# Patient Record
Sex: Male | Born: 1990
Health system: Southern US, Community
[De-identification: ages and names within clinical notes are randomized; demographics above are authoritative.]

## PROBLEM LIST (undated history)

## (undated) DIAGNOSIS — B019 Varicella without complication: Secondary | ICD-10-CM

## (undated) DIAGNOSIS — B001 Herpesviral vesicular dermatitis: Secondary | ICD-10-CM

## (undated) HISTORY — DX: Varicella without complication: B01.9

## (undated) HISTORY — DX: Herpesviral vesicular dermatitis: B00.1

## (undated) HISTORY — PX: WISDOM TOOTH EXTRACTION: SHX21

---

## 2014-02-14 HISTORY — PX: OTHER SURGICAL HISTORY: SHX169

## 2015-09-22 ENCOUNTER — Telehealth: Payer: Self-pay | Admitting: Behavioral Health

## 2015-09-22 NOTE — Telephone Encounter (Signed)
Attempted to reach patient for Pre-Visit Call. Unable to leave a message; per recording, the patient's voice mailbox is full and cannot accept any messages at this time.

## 2015-09-23 ENCOUNTER — Ambulatory Visit (INDEPENDENT_AMBULATORY_CARE_PROVIDER_SITE_OTHER): Payer: 59 | Admitting: Family Medicine

## 2015-09-23 ENCOUNTER — Encounter: Payer: Self-pay | Admitting: Family Medicine

## 2015-09-23 VITALS — BP 130/68 | HR 78 | Temp 98.2°F | Ht 68.0 in | Wt 143.2 lb

## 2015-09-23 DIAGNOSIS — Z7189 Other specified counseling: Secondary | ICD-10-CM

## 2015-09-23 DIAGNOSIS — B001 Herpesviral vesicular dermatitis: Secondary | ICD-10-CM | POA: Diagnosis not present

## 2015-09-23 DIAGNOSIS — Z7689 Persons encountering health services in other specified circumstances: Secondary | ICD-10-CM

## 2015-09-23 MED ORDER — VALACYCLOVIR HCL 1 G PO TABS: ORAL_TABLET | ORAL | 2 refills | Status: DC

## 2015-09-23 NOTE — Patient Instructions (Signed)
It was nice to meet you today- I hope that your ankle continue to improve You can use the valtrex as needed for cold sore- start the treatment at the first sign of a cold sore outbreak Please come and see me for a physical at your convenience. If you could bring a copy of your labs from work that would be great.  Take care!

## 2015-09-23 NOTE — Progress Notes (Signed)
Pre visit review using our clinic review tool, if applicable. No additional management support is needed unless otherwise documented below in the visit note. 

## 2015-09-23 NOTE — Progress Notes (Signed)
[facility] [address], [address] [place], [place] [address] [phone] [phone]  Date:  09/23/2015   Name:  Brian Jones   DOB:  12-09-1990   MRN:  [id number]  PCP:  Abbe AmsterdamOPLAND,Erica Osuna, MD    Chief Complaint: Establish Care (Pt here to est care. Refill needed on valacyclovir and would also like to make sure )   History of Present Illness:  Brian Grantmmett Leask is a 25 y.o. 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 He also got shingles last year during illness. This is now resolved  He had an ankle operation in December- done by Dr. Lajoyce Cornersuda. It seems to be doing well for him, is improving He plays soccer and has been able to play again just recently He has had multiple ankle sprains over his lifetime which led to his eventual surgery  He did have a health screening at work- he did well on his labs as far as he knows He plans to have a CPE with me soon and would rather do a tetanus booster then  There are no active problems to display for this patient.   Past Medical History:  Diagnosis Date  . Chicken pox   . Recurrent cold sores     Past Surgical History:  Procedure Laterality Date  . Reconstructive ankle surgery  2016  . WISDOM TOOTH EXTRACTION      Social History  Substance Use Topics  . Smoking status: Never Smoker  . Smokeless tobacco: Never Used  . Alcohol use 3.6 oz/week    6 Cans of beer per week    Family History  Problem Relation Age of Onset  . Healthy Mother   . Healthy Father   . Healthy Sister   . Healthy Brother   . Alzheimer's disease Maternal Grandmother     Allergies  Allergen Reactions  . Amoxicillin Hives     Medication list has been reviewed and updated.  No current outpatient prescriptions on file prior to visit.   No current facility-administered medications on file prior to visit.     Review of Systems:  As per HPI- otherwise negative. No fever, chills, nausea, vomiting, rash, abd pain, diarrhea, chest pain or SOB  Physical Examination: Vitals:   09/23/15 1337  BP: 140/69  Pulse: 78  Temp: 98.2 F (36.8 C)   Vitals:   09/23/15 1337  Weight: 143 lb 3.2 oz (65 kg)  Height: 5\' 8"  (1.727 m)   Body mass index is 21.77 kg/m. Ideal Body Weight: Weight in (lb) to have BMI = 25: 164.1  GEN: WDWN, NAD, Non-toxic, A & O x 3, normal weight, looks well HEENT: Atraumatic, Normocephalic. Neck supple. No masses, No LAD.  Bilateral TM wnl, oropharynx normal.  PEERL,EOMI.   Ears and Nose: No external deformity. CV: RRR, No M/G/R. No JVD. No thrill. No extra heart sounds. PULM: CTA B, no wheezes, crackles, rhonchi. No retractions. No resp. distress. No accessory muscle use. EXTR: No c/c/e NEURO Normal gait.  PSYCH: Normally interactive. Conversant. Not depressed or anxious appearing.  Calm demeanor.    Assessment and Plan: Cold sore - Plan: valACYclovir (VALTREX)  1000 MG tablet  Encounter to establish care  Here today to establish care.  Refilled his valtrex that he uses for occasional cold sores He will see me for a CPE soon   Signed Abbe Amsterdam, MD

## 2015-10-29 ENCOUNTER — Encounter: Payer: 59 | Admitting: Family Medicine

## 2016-05-26 ENCOUNTER — Ambulatory Visit (INDEPENDENT_AMBULATORY_CARE_PROVIDER_SITE_OTHER): Payer: BLUE CROSS/BLUE SHIELD | Admitting: Family Medicine

## 2016-05-26 ENCOUNTER — Encounter: Payer: Self-pay | Admitting: Family Medicine

## 2016-05-26 VITALS — BP 130/74 | HR 72 | Temp 97.8°F | Ht 68.0 in | Wt 147.6 lb

## 2016-05-26 DIAGNOSIS — Z7189 Other specified counseling: Secondary | ICD-10-CM

## 2016-05-26 DIAGNOSIS — Z23 Encounter for immunization: Secondary | ICD-10-CM

## 2016-05-26 DIAGNOSIS — Z7184 Encounter for health counseling related to travel: Secondary | ICD-10-CM

## 2016-05-26 MED ORDER — TYPHOID VACCINE PO CPDR: 1.0000 | DELAYED_RELEASE_CAPSULE | ORAL | 0 refills | Status: DC

## 2016-05-26 NOTE — Patient Instructions (Signed)
It was nice to see you today- best wishes for your wedding and honeymoon, I hope you two have a wonderful time!  You got your tetanus booster and hepatitis A vaccine today. You can have your 2nd hep A shot in 6 months if your like  We also gave you an rx for the oral typhoid vaccine today- take 1 dose every other day for 4 doses (8 days total).  Do this asap as you want to complete the series at least a week prior to your trip.

## 2016-05-26 NOTE — Progress Notes (Signed)
[facility] [address] [place], [place] [address] [phone]  Date:  05/26/2016   Name:  Brian Jones   DOB:  [birth year]   MRN:  [id number]  PCP:  Abbe Amsterdam, MD    Chief Complaint: Immunizations (Pt will need vaccines for Honeymoon to [place]. )   History of Present Illness:  Brian Jones is a 26 y.o. very pleasant male patient who presents with the following:  Generally healthy young man here today to discuss upcoming travel plans His wedding is in about 2 weeks, and they are traveling to [place].   He is not sure if he had the Hep A series and these records are not readily attainable.  Will give his first shot today.  We do feel confident that he had the hep B series however His last tetanus was over 10 years ago, he is not sure if he had the tdap or not. Would like to update this today He would like to have typhoid vaccine prior to his trip  He is otherwise feeling well and has no concerns He did feel a pain and pull in his left groin a couple of weeks ago while lifting weights, the pain is now resolved and he does not note any bulge.   He declines to have me examine this today as he thinks it is back to normal, just wanted to mention it  Patient Active Problem List   Diagnosis Date Noted  . Cold sore 09/23/2015    Past Medical History:  Diagnosis Date  . Chicken pox   . Recurrent cold sores     Past Surgical History:  Procedure Laterality Date  . Reconstructive ankle surgery  2016  . WISDOM TOOTH EXTRACTION      Social History  Substance Use Topics  . Smoking status: Never Smoker  . Smokeless tobacco: Never Used  . Alcohol use 3.6 oz/week    6 Cans of beer per week    Family History  Problem Relation Age of Onset  . Healthy Mother   . Healthy Father   . Healthy Sister   . Healthy Brother   . Alzheimer's disease Maternal Grandmother     Allergies  Allergen Reactions   . Amoxicillin Hives    Medication list has been reviewed and updated.  Current Outpatient Prescriptions on File Prior to Visit  Medication Sig Dispense Refill  . Multiple Vitamin (MULTIVITAMIN) tablet Take 1 tablet by mouth daily.    . traMADol (ULTRAM) 50 MG tablet Take 50 mg by mouth every 6 (six) hours as needed.    . valACYclovir (VALTREX) 1000 MG tablet For cold sore outbreak: take 2 pills, then repeat 2 pills 12 hours later 20 tablet 2   No current facility-administered medications on file prior to visit.     Review of Systems:  As per HPI- otherwise negative.   Physical Examination: Vitals:   05/26/16 1504  BP: 130/74  Pulse: 72  Temp: 97.8 F (36.6 C)   Vitals:   05/26/16 1504  Weight: 147 lb 9.6 oz (67 kg)  Height:  (1.727 m)   Body mass index is 22.44 kg/m. Ideal Body Weight: Weight in (lb) to have BMI = 25: 164.1  GEN: WDWN, NAD, Non-toxic, A & O x 3, looks well HEENT: Atraumatic, Normocephalic. Neck supple. No masses, No LAD. Ears and Nose: No external deformity. CV: RRR, No M/G/R. No JVD. No thrill. No  extra heart sounds. PULM: CTA B, no wheezes, crackles, rhonchi. No retractions. No resp. distress. No accessory muscle use. EXTR: No c/c/e NEURO Normal gait.  PSYCH: Normally interactive. Conversant. Not depressed or anxious appearing.  Calm demeanor.    Assessment and Plan: Travel advice encounter - Plan: Tdap vaccine greater than or equal to 7yo IM, Hepatitis A vaccine adult IM, typhoid (VIVOTIF) DR capsule  Vaccines for travel- tdap, hep A, oral typhoid. He is not going anywhere in [place] where malaria is considered to be a risk, declines prophylaxis for this today Advised him to avoid weight lifting until his groin is 100% better, and to avoid valsalva when lifting.  He will let us know if any other concerns   Signed Abbe Amsterdam, MD

## 2016-11-01 DIAGNOSIS — K529 Noninfective gastroenteritis and colitis, unspecified: Secondary | ICD-10-CM | POA: Diagnosis not present

## 2017-01-18 ENCOUNTER — Telehealth: Payer: Self-pay | Admitting: Family Medicine

## 2017-01-18 NOTE — Telephone Encounter (Signed)
Copied from CRM [phone]. Topic: Quick Communication - Rx Refill/Question >> Jan 18, 2017 11:57 AM Percival SpanishKennedy, Cheryl W wrote: Has the patient contacted their pharmacy NO MORE REFILLS     RX      valACYclovir (VALTREX) 1000 MG tablet   Preferred Pharmacy (with phone number or street name)  [facility] [address]   Agent: Please be advised that RX refills may take up to 3 business days. We ask that you follow-up with your pharmacy.

## 2017-01-19 ENCOUNTER — Other Ambulatory Visit: Payer: Self-pay | Admitting: Emergency Medicine

## 2017-01-19 DIAGNOSIS — B001 Herpesviral vesicular dermatitis: Secondary | ICD-10-CM

## 2017-01-19 MED ORDER — VALACYCLOVIR HCL 1 G PO TABS
ORAL_TABLET | ORAL | 2 refills | Status: DC
Start: 2017-01-19 — End: 2018-03-08

## 2017-01-19 NOTE — Telephone Encounter (Signed)
See attached

## 2017-01-20 NOTE — Telephone Encounter (Signed)
Rx sent 01/19/2017.

## 2017-02-08 DIAGNOSIS — R05 Cough: Secondary | ICD-10-CM | POA: Diagnosis not present

## 2017-02-14 NOTE — Progress Notes (Signed)
[facility] [address], [address] [place], [place] [address] [phone] [phone]  Date:  02/16/2017   Name:  Brian Jones   DOB:  [birth year]   MRN:  [id number]  PCP:  Pearline Cablesopland, Jessica C, MD    Chief Complaint: Mass (c/o lump felt on left side of neck x 4 weeks. ) and Bronchitis (dx with bronchitis by Urgent Care on Dec.26th, completed Prednisone but still having sx's. )   History of Present Illness:  Brian Jones is a 27 y.o. very pleasant male patient who presents with the following:  Generally healthy young man here today with concern of bump on his neck.   He got married last year and went [place] a [place]ymoon to [place].  They had a wonderful time.  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   He noted a small lump in his left neck for 3-4 weeks.  Does not seem to be changing or getting bigger On 12/26 he was diagnosed with bronchitis and was treated with prednisone and cough syrup He still does have some cough but it is getting better gradually  He did have a fever the first few days of bronchitis Also exposed to pneumonia and the flu  No sore throat He has noted some swelling of his other cervical nodes   He is generally in good health   No weight loss, no rashes  Admits that he has read some things on the Internet that scared him, he is afraid that he could have leukemia  Patient Active Problem List   Diagnosis Date Noted  . Cold sore 09/23/2015    Past Medical History:  Diagnosis Date  . Chicken pox   . Recurrent cold sores     Past Surgical History:  Procedure Laterality Date  . Reconstructive ankle surgery  2016  . WISDOM TOOTH EXTRACTION      Social History   Tobacco Use  . Smoking status: Never Smoker  . Smokeless  tobacco: Never Used  Substance Use Topics  . Alcohol use: Yes    Alcohol/week: 3.6 oz    Types: 6 Cans of beer per week  . Drug use: Not on file    Family History  Problem Relation Age of Onset  . Healthy Mother   . Healthy Father   . Healthy Sister   . Healthy Brother   . Alzheimer's disease Maternal Grandmother     Allergies  Allergen Reactions  . Amoxicillin Hives    Medication list has been reviewed and updated.  Current Outpatient Medications on File Prior to Visit  Medication Sig Dispense Refill  . valACYclovir (VALTREX) 1000 MG tablet For cold sore outbreak: take 2 pills, then repeat 2 pills 12 hours later 20 tablet 2   No current facility-administered medications on file prior to visit.     Review of Systems:  As per HPI- otherwise negative.   Physical Examination: Vitals:   02/16/17 1400  BP: 132/82  Pulse: 94  Temp: 98.1 F (36.7 C)  SpO2: 98%   Vitals:   02/16/17 1400  Weight: 145 lb 6.4 oz (66 kg)  Height: 5\' 8"  (1.727 m)   Body mass index is 22.11 kg/m. Ideal  Body Weight: Weight in (lb) to have BMI = 25: 164.1  GEN: WDWN, NAD, Non-toxic, A & O x 3, slim build, looks well  HEENT: Atraumatic, Normocephalic. Neck supple. No masses, No LAD. Bilateral TM wnl, oropharynx normal.  PEERL,EOMI.   Ears and Nose: No external deformity. CV: RRR, No M/G/R. No JVD. No thrill. No extra heart sounds. PULM: CTA B, no wheezes, crackles, rhonchi. No retractions. No resp. distress. No accessory muscle use. ABD: S, NT, ND, +BS. No rebound. No HSM. EXTR: No c/c/e NEURO Normal gait.  PSYCH: Normally interactive. Conversant. Not depressed or anxious appearing.  Calm demeanor.  He indicated a possible shotty node in the left anterior cervical chain- tiny. No other noted noted in the neck, supraclavicular  Areas   Assessment and Plan: Cervical lymphadenopathy - Plan: CBC, US Soft Tissue Head/Neck  Here today with concern of lymphadenopathy in his neck. I do not  really appreciate anything abnormal on exam today, but will run a CBC and get an [place] to make sure all is well  Results for orders placed or performed in visit on 02/16/17  CBC  Result Value Ref Range   WBC 6.6 4.0 - 10.5 K/uL   RBC 4.82 4.22 - 5.81 Mil/uL   Platelets 329.0 150.0 - 400.0 K/uL   Hemoglobin 14.8 13.0 - 17.0 g/dL   HCT 78.2 95.6 - 21.3 %   MCV 89.9 78.0 - 100.0 fl   MCHC 34.2 30.0 - 36.0 g/dL   RDW 08.6 57.8 - 46.9 %     Signed Abbe Amsterdam, MD

## 2017-02-16 ENCOUNTER — Encounter: Payer: Self-pay | Admitting: Family Medicine

## 2017-02-16 ENCOUNTER — Ambulatory Visit (INDEPENDENT_AMBULATORY_CARE_PROVIDER_SITE_OTHER): Payer: BLUE CROSS/BLUE SHIELD | Admitting: Family Medicine

## 2017-02-16 VITALS — BP 132/82 | HR 94 | Temp 98.1°F | Ht 68.0 in | Wt 145.4 lb

## 2017-02-16 DIAGNOSIS — R590 Localized enlarged lymph nodes: Secondary | ICD-10-CM

## 2017-02-16 LAB — CBC
HCT: 43.3 % (ref 39.0–52.0)
HEMOGLOBIN: 14.8 g/dL (ref 13.0–17.0)
MCHC: 34.2 g/dL (ref 30.0–36.0)
MCV: 89.9 fl (ref 78.0–100.0)
PLATELETS: 329 10*3/uL (ref 150.0–400.0)
RBC: 4.82 Mil/uL (ref 4.22–5.81)
RDW: 13 % (ref 11.5–15.5)
WBC: 6.6 10*3/uL (ref 4.0–10.5)

## 2017-02-16 NOTE — Patient Instructions (Addendum)
Please give it about one more week and then have your flu shot We will get a blood count today and set you up for an ultrasound of your neck. However, I think that everything is likely ok! c

## 2017-02-22 ENCOUNTER — Encounter: Payer: Self-pay | Admitting: Family Medicine

## 2017-02-22 ENCOUNTER — Ambulatory Visit (HOSPITAL_BASED_OUTPATIENT_CLINIC_OR_DEPARTMENT_OTHER)
Admission: RE | Admit: 2017-02-22 | Discharge: 2017-02-22 | Disposition: A | Discharge status: Home / Self Care | Payer: BLUE CROSS/BLUE SHIELD | Source: Ambulatory Visit | Attending: Family Medicine | Admitting: Family Medicine

## 2017-02-22 DIAGNOSIS — R590 Localized enlarged lymph nodes: Secondary | ICD-10-CM | POA: Insufficient documentation

## 2017-03-15 ENCOUNTER — Ambulatory Visit (INDEPENDENT_AMBULATORY_CARE_PROVIDER_SITE_OTHER): Payer: Self-pay | Admitting: Orthopedic Surgery

## 2018-03-08 ENCOUNTER — Other Ambulatory Visit: Payer: Self-pay | Admitting: Family Medicine

## 2018-03-08 DIAGNOSIS — B001 Herpesviral vesicular dermatitis: Secondary | ICD-10-CM

## 2018-03-08 MED ORDER — VALACYCLOVIR HCL 1 G PO TABS: ORAL_TABLET | ORAL | 0 refills | Status: DC

## 2018-03-08 NOTE — Telephone Encounter (Signed)
Pt has appt 03/14/2018 Dr. Patsy Lageropland

## 2018-03-08 NOTE — Telephone Encounter (Signed)
Copied from CRM [id number]. Topic: Quick Communication - Rx Refill/Question >> Mar 08, 2018 11:44 AM Baldo Daub L wrote: Medication: valACYclovir (VALTREX) 1000 MG tablet  Has the patient contacted their pharmacy? Yes - no refills on file (Agent: If no, request that the patient contact the pharmacy for the refill.) (Agent: If yes, when and what did the pharmacy advise?)  Preferred Pharmacy (with phone number or street name): [id number] [place] [place] - [address] [phone] (Phone) [phone] (Fax)  Agent: Please be advised that RX refills may take up to 3 business days. We ask that you follow-up with your pharmacy.

## 2018-03-10 NOTE — Progress Notes (Signed)
[facility] [address] [place], [place] [address] [phone]  Date:  03/14/2018   Name:  Brian Jones   DOB:  [birth year]   MRN:  [id number]  PCP:  Pearline Cables, MD    Chief Complaint: Medication Refills (no concerns)   History of Present Illness:  Brian Jones is a 28 y.o. very pleasant male patient who presents with the following:  Here today to discuss medications I last saw him about a year ago, with concern of a lymph node in his neck.  We did a CBC and ultrasound were both normal He feels like this area on his left neck is about the same, no change  He just wanted to mention it   He is married, they have 3 dogs  He uses valtrex rarely for cold sore; he may get a couple of cold sores back to back about one a year He would like to do a flu shot today   No night sweats or weight loss Declines routine labs today  He and his wife just bought tickets for a trip to [place] for a friend's wedding.  This sounds like a wonderful vacation  Patient Active Problem List   Diagnosis Date Noted  . Cold sore 09/23/2015    Past Medical History:  Diagnosis Date  . Chicken pox   . Recurrent cold sores     Past Surgical History:  Procedure Laterality Date  . Reconstructive ankle surgery  2016  . WISDOM TOOTH EXTRACTION      Social History   Tobacco Use  . Smoking status: Never Smoker  . Smokeless tobacco: Never Used  Substance Use Topics  . Alcohol use: Yes    Alcohol/week: 6.0 standard drinks    Types: 6 Cans of beer per week  . Drug use: Not on file    Family History  Problem Relation Age of Onset  . Healthy Mother   . Healthy Father   . Healthy Sister   . Healthy Brother   . Alzheimer's disease Maternal Grandmother     Allergies  Allergen Reactions  . Amoxicillin Hives    Medication list has been reviewed and updated.  Current Outpatient Medications on File Prior to Visit   Medication Sig Dispense Refill  . valACYclovir (VALTREX) 1000 MG tablet For cold sore outbreak: take 2 pills, then repeat 2 pills 12 hours later 20 tablet 0   No current facility-administered medications on file prior to visit.     Review of Systems:  As per HPI- otherwise negative. No fever or chills  Physical Examination: Vitals:   03/14/18 1059  BP: 132/88  Pulse: 73  Resp: 16  SpO2: 99%   Vitals:   03/14/18 1059  Weight: 152 lb (68.9 kg)  Height: 5\' 8"  (1.727 m)   Body mass index is 23.11 kg/m. Ideal Body Weight: Weight in (lb) to have BMI = 25: 164.1  GEN: WDWN, NAD, Non-toxic, A & O x 3, normal weight, looks well  HEENT: Atraumatic, Normocephalic. Neck supple. No masses, No LAD.  Bilateral TM wnl, oropharynx normal.  PEERL,EOMI.   No abnormal cervical, head, axially or supraclavicular nodes  Ears and Nose: No external deformity. CV: RRR, No M/G/R. No JVD. No thrill. No extra heart sounds. PULM: CTA B, no wheezes, crackles, rhonchi. No retractions. No resp. distress. No accessory muscle use. EXTR: No c/c/e NEURO Normal gait.  PSYCH: Normally interactive. Conversant. Not depressed  or anxious appearing.  Calm demeanor.    Assessment and Plan: Cold sore  Here today to discuss his Valtrex.  I actually had refill this already, but he was told to come in for a visit as it has been over a year Continues Valtrex as needed Flu shot given Discussed his cervical lymph node, offered to do a repeat ultrasound now, but he declines.  He will let me know if any other concerns  Signed Abbe Amsterdam, MD

## 2018-03-14 ENCOUNTER — Encounter: Payer: Self-pay | Admitting: Family Medicine

## 2018-03-14 ENCOUNTER — Ambulatory Visit: Payer: BLUE CROSS/BLUE SHIELD | Admitting: Family Medicine

## 2018-03-14 VITALS — BP 132/88 | HR 73 | Resp 16 | Ht 68.0 in | Wt 152.0 lb

## 2018-03-14 DIAGNOSIS — B001 Herpesviral vesicular dermatitis: Secondary | ICD-10-CM | POA: Diagnosis not present

## 2018-03-14 NOTE — Patient Instructions (Signed)
It was great to see you today, have a wonderful time on your upcoming trip You got your flu shot today I do not feel any concerning lymph node in your neck, but certainly if you feel it is getting larger or you are concerned we could do an ultrasound at any time Please do tell me if you have any unusual symptoms, such as unexplained weight loss or night sweats

## 2018-10-29 DIAGNOSIS — Z1283 Encounter for screening for malignant neoplasm of skin: Secondary | ICD-10-CM | POA: Diagnosis not present

## 2018-10-29 DIAGNOSIS — D225 Melanocytic nevi of trunk: Secondary | ICD-10-CM | POA: Diagnosis not present

## 2018-12-15 IMAGING — US US SOFT TISSUE HEAD/NECK
1 series · 14 of 25 positions shown · non-contrast
Comparison: None.

CLINICAL DATA: Recent respiratory illness. Palpable left
adenopathy.

EXAM:
ULTRASOUND OF HEAD/NECK SOFT TISSUES
TECHNIQUE: Ultrasound examination of the head and neck soft tissues was
performed in the area of clinical concern.

[Series 1: us soft tissue head/neck · 0.06mm/px · 14 of 27 slices shown]
[im 1/27]
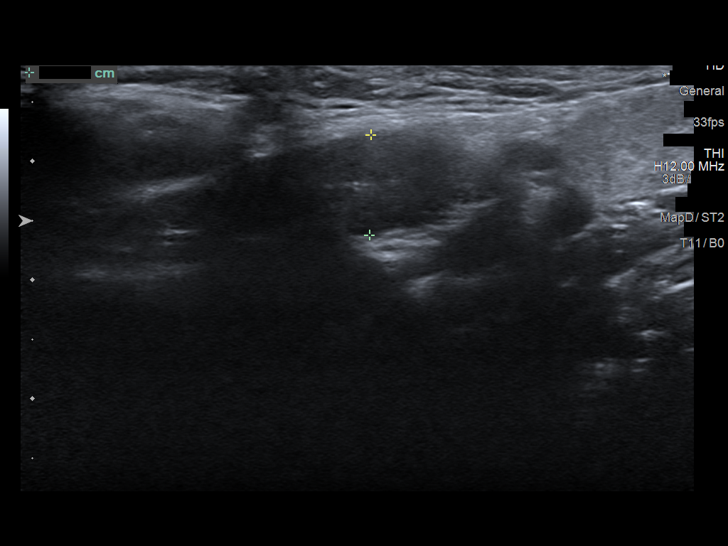
[im 3/27]
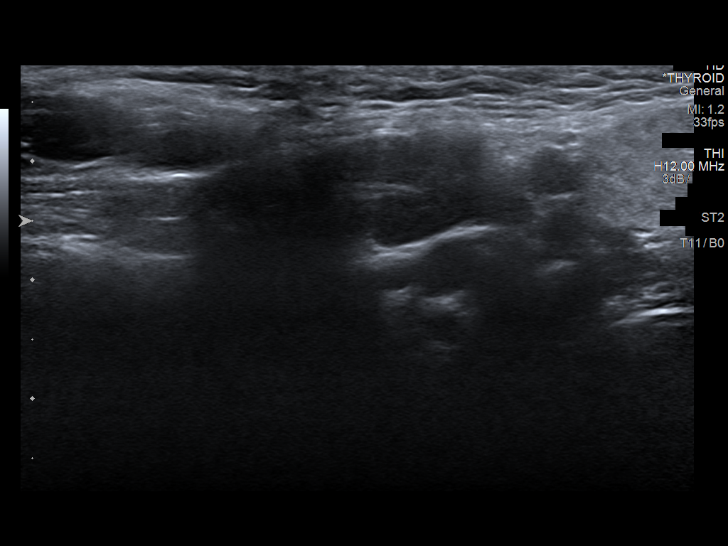
[im 5/27]
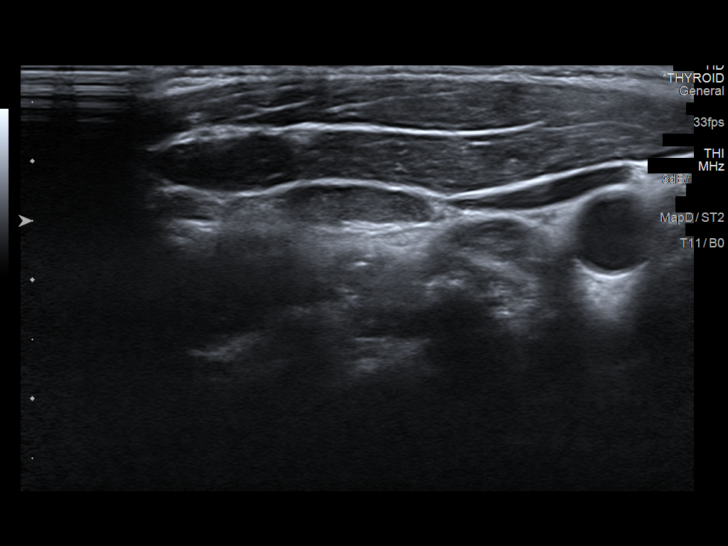
[im 7/27]
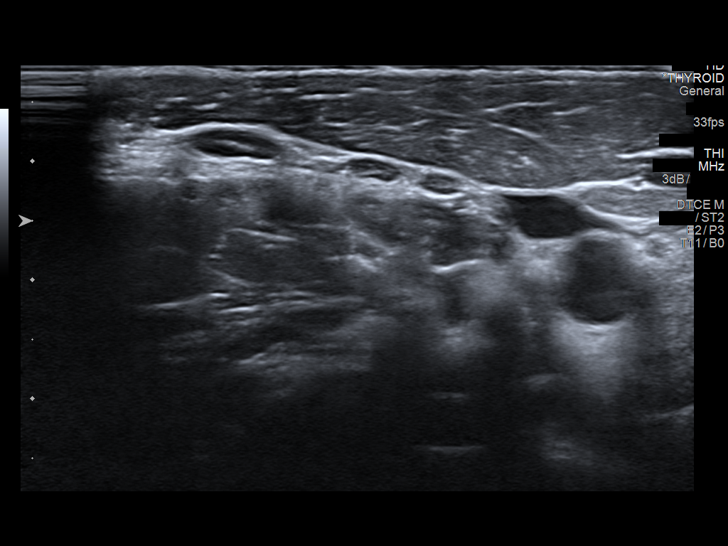
[im 9/27]
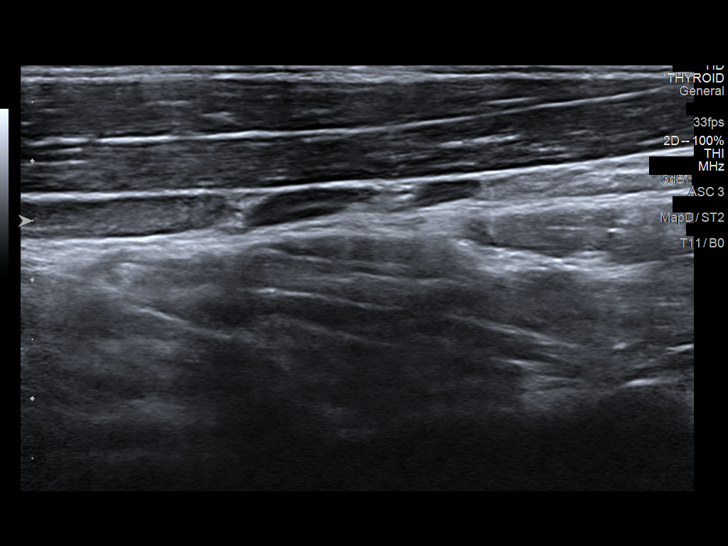
[im 10/27]
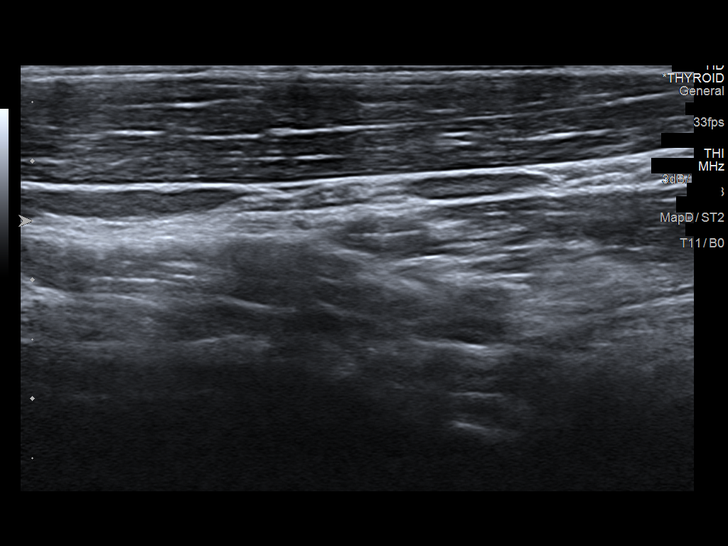
[im 12/27]
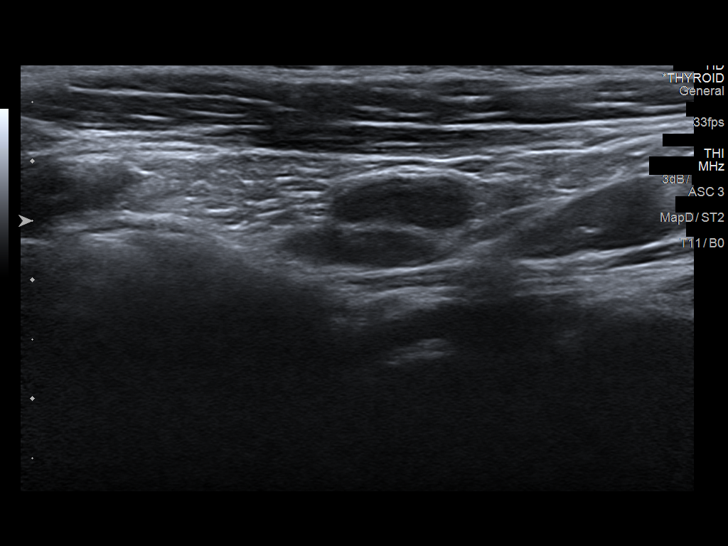
[im 15/27]
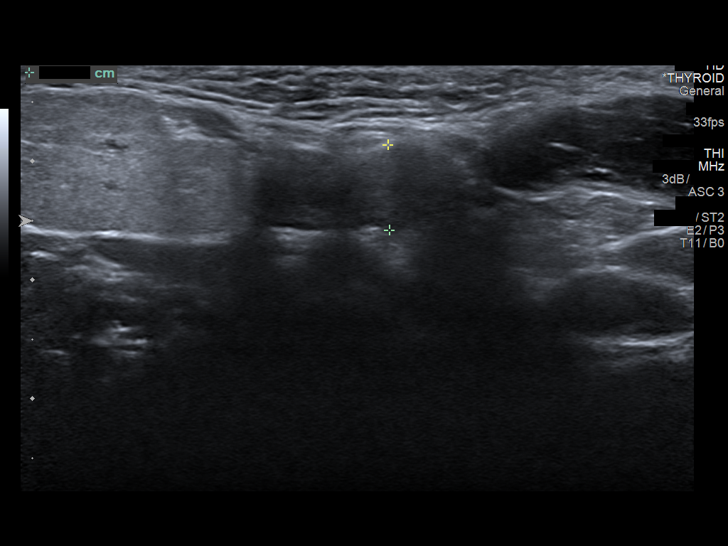
[im 17/27]
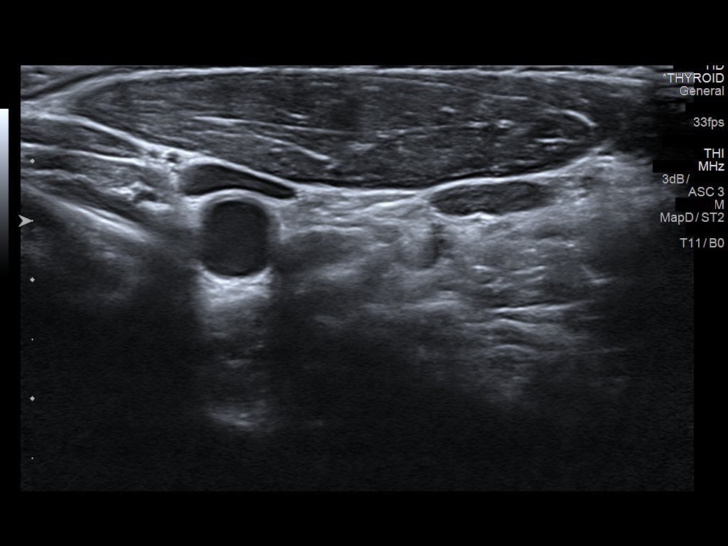
[im 18/27]
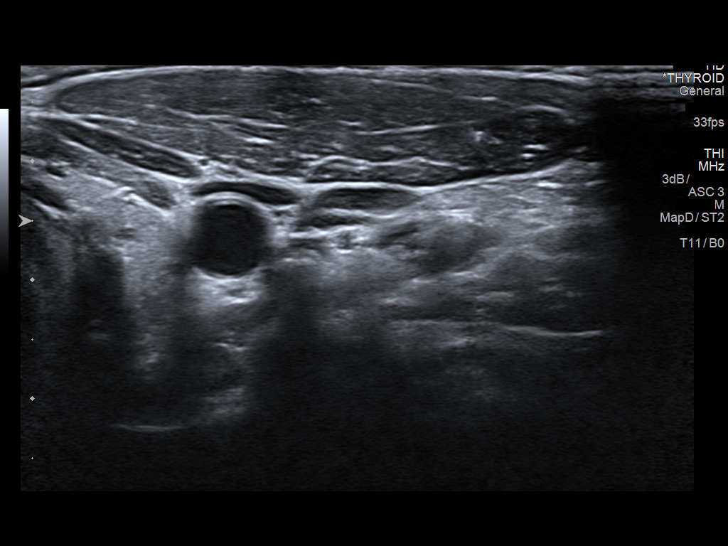
[im 20/27]
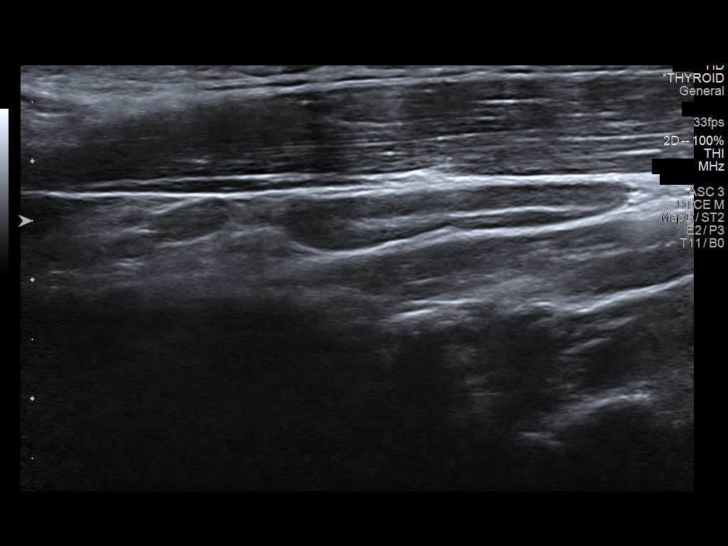
[im 22/27]
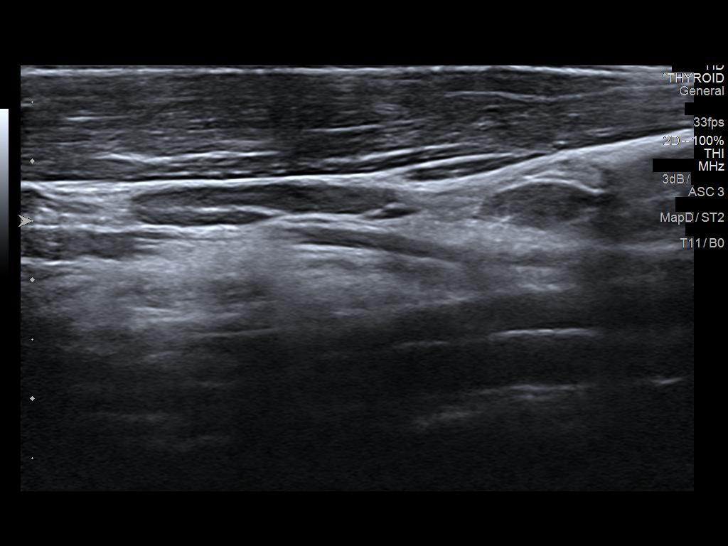
[im 24/27]
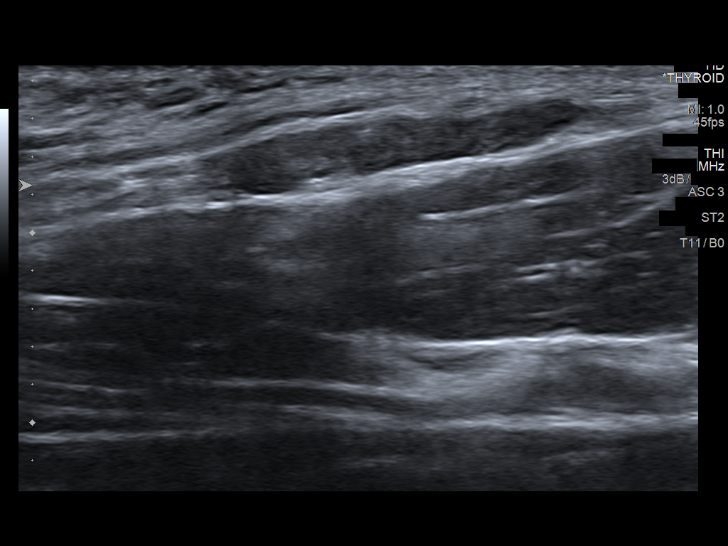
[im 27/27]
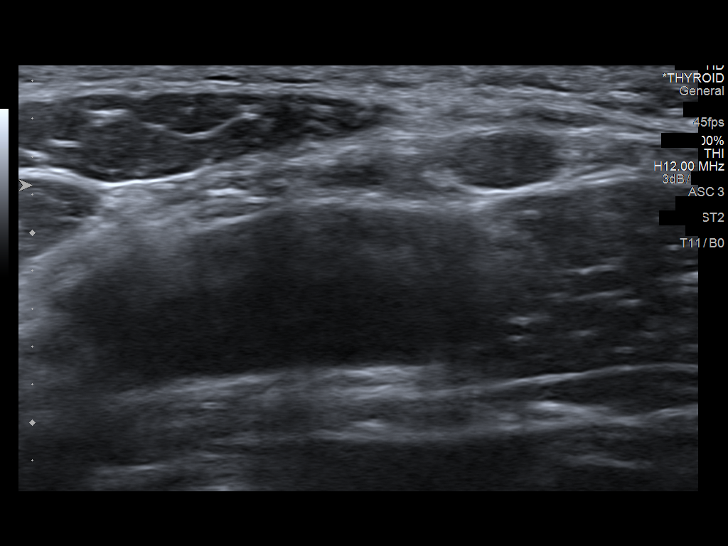

[14 of 25 positions shown; findings below may reference images not displayed]

FINDINGS: Morphologically unremarkable cervical lymph nodes are identified,
largest in the submandibular region measured up to 0.7 cm short axis
diameter left, 0.8 cm right. The region of concern corresponds to a
0.3 cm short axis diameter left cervical chain lymph node.
IMPRESSION: 1. Bilateral cervical lymph nodes without pathologic findings.

## 2019-01-04 DIAGNOSIS — Z20828 Contact with and (suspected) exposure to other viral communicable diseases: Secondary | ICD-10-CM | POA: Diagnosis not present

## 2019-02-07 DIAGNOSIS — Z20828 Contact with and (suspected) exposure to other viral communicable diseases: Secondary | ICD-10-CM | POA: Diagnosis not present

## 2019-02-24 DIAGNOSIS — R519 Headache, unspecified: Secondary | ICD-10-CM | POA: Diagnosis not present

## 2019-02-24 DIAGNOSIS — R05 Cough: Secondary | ICD-10-CM | POA: Diagnosis not present

## 2019-02-24 DIAGNOSIS — Z20822 Contact with and (suspected) exposure to covid-19: Secondary | ICD-10-CM | POA: Diagnosis not present

## 2019-04-24 DIAGNOSIS — L570 Actinic keratosis: Secondary | ICD-10-CM | POA: Diagnosis not present

## 2019-04-24 DIAGNOSIS — X32XXXA Exposure to sunlight, initial encounter: Secondary | ICD-10-CM | POA: Diagnosis not present

## 2019-09-25 DIAGNOSIS — Z20822 Contact with and (suspected) exposure to covid-19: Secondary | ICD-10-CM | POA: Diagnosis not present

## 2019-11-14 DIAGNOSIS — Z20822 Contact with and (suspected) exposure to covid-19: Secondary | ICD-10-CM | POA: Diagnosis not present

## 2020-01-07 DIAGNOSIS — R509 Fever, unspecified: Secondary | ICD-10-CM | POA: Diagnosis not present

## 2020-01-07 DIAGNOSIS — Z20822 Contact with and (suspected) exposure to covid-19: Secondary | ICD-10-CM | POA: Diagnosis not present

## 2020-01-07 DIAGNOSIS — J101 Influenza due to other identified influenza virus with other respiratory manifestations: Secondary | ICD-10-CM | POA: Diagnosis not present

## 2020-01-07 DIAGNOSIS — G4489 Other headache syndrome: Secondary | ICD-10-CM | POA: Diagnosis not present

## 2020-02-11 NOTE — Patient Instructions (Addendum)
Good to see you again today - I am sorry you are not feeling great Please go to lab and then have an x-ray on the ground floor Your EKG looks fine I will be in touch with your other results asap  Your symptoms may be related to your shot, or could be unrelated- difficulty to know for sure!  I would like to treat you for possible pericarditis which can occur after immunization (or randomly) Please take colchicine once a day, and ibuprofen 600 mg three times a day I will be in touch with your labs asap which will help guide [place] regarding how long we need to continue these medications

## 2020-02-11 NOTE — Progress Notes (Addendum)
[facility] [address], [address] [place], [place] [address] [phone]  Date:  02/12/2020   Name:  Brian Jones   DOB:  03-07-1990   MRN:  [id number]  PCP:  Pearline Cables, MD    Chief Complaint: Covid Booster Reaction (Chest pain after running after first vaccine, Dull chest pain since last week comes an goes, no SOB/)   History of Present Illness:  Brian Jones is a 29 y.o. very pleasant male patient who presents with the following:  Generally healthy young man here today with concern of chest pain  I last saw this pt in 02/2018 for a cold sore He got his covid booster this past Monday- today is Wednesday He has noted a dull, off and on chest pain which started yesterday am  Thinking back he did have some CP for about 2 weeks after his 2nd dose of the primary series a few months ago- this did go away within about 2 weeks so he did not think much of it No cough or fever He has not exercised since sx began but no SOB noted during day-to-day activity  He does not have any history of heart disease, no history of pericarditis No fever or other symptoms of illness  He is not a smoker Married to [facility]    Patient Active Problem List   Diagnosis Date Noted  . Cold sore 09/23/2015    Past Medical History:  Diagnosis Date  . Chicken pox   . Recurrent cold sores     Past Surgical History:  Procedure Laterality Date  . Reconstructive ankle surgery  2016  . WISDOM TOOTH EXTRACTION      Social History   Tobacco Use  . Smoking status: Never Smoker  . Smokeless tobacco: Never Used  Substance Use Topics  . Alcohol use: Yes    Alcohol/week: 6.0 standard drinks    Types: 6 Cans of beer per week    Family History  Problem Relation Age of Onset  . Healthy Mother   . Healthy Father   . Healthy Sister   . Healthy Brother   . Alzheimer's disease Maternal Grandmother     Allergies  Allergen Reactions  .  Amoxicillin Hives    Medication list has been reviewed and updated.  Current Outpatient Medications on File Prior to Visit  Medication Sig Dispense Refill  . valACYclovir (VALTREX) 1000 MG tablet For cold sore outbreak: take 2 pills, then repeat 2 pills 12 hours later 20 tablet 0   No current facility-administered medications on file prior to visit.    Review of Systems:  As per HPI- otherwise negative.   Physical Examination: Vitals:   02/12/20 1402  BP: 132/84  Pulse: 87  Resp: 17  SpO2: 98%   Vitals:   02/12/20 1402  Weight: 150 lb (68 kg)  Height: 5\' 8"  (1.727 m)   Body mass index is 22.81 kg/m. Ideal Body Weight: Weight in (lb) to have BMI = 25: 164.1  BP Readings from Last 3 Encounters:  02/12/20 132/84  03/14/18 132/88  02/16/17 132/82   Pulse Readings from Last 3 Encounters:  02/12/20 87  03/14/18 73  02/16/17 94    GEN: no acute distress.  Well-appearing young man, normal weight HEENT: Atraumatic, Normocephalic.   Bilateral TM wnl, oropharynx normal.  PEERL,EOMI.   Ears and Nose: No external deformity. CV: RRR, No M/G/R. No JVD. No thrill. No extra heart  sounds. PULM: CTA B, no wheezes, crackles, rhonchi. No retractions. No resp. distress. No accessory muscle use. ABD: S, NT, ND, +BS. No rebound. No HSM. EXTR: No c/c/e PSYCH: Normally interactive. Conversant.  His chest pain is somewhat reproducible by pressing on chest wall  EKG: NSR-no ST changes or evidence of pericarditis No old tracings for comparison Assessment and Plan: Chest pain, unspecified type - Plan: EKG 12-Lead, CBC, Troponin I (High Sensitivity), C-reactive protein, Sedimentation rate, ibuprofen (ADVIL) 600 MG tablet, colchicine 0.6 MG tablet, Comprehensive metabolic panel, DG Chest 2 View, CANCELED: DG Chest 2 View, CANCELED: DG Chest 2 View  Patient today with left-sided, mild chest pain for about 18 hours, may be related to recent COVID-19 booster shot.  He had an apparent similar  reaction to his second dose of the primary series a few months ago No shortness of breath, EKG is reassuring Musculoskeletal pain is probable.  However, also consider possibility of early pericarditis Will obtain chest film, start on colchicine and ibuprofen while we await cardiac markers as above Also ordered stat troponin Patient declines emergency room evaluation at this time, he is instructed to go to the emergency room if his symptoms worsen Will plan further follow- up pending labs.  This visit occurred during the SARS-CoV-2 public health emergency.  Safety protocols were in place, including screening questions prior to the visit, additional usage of staff PPE, and extensive cleaning of exam room while observing appropriate contact time as indicated for disinfecting solutions.    Signed Abbe Amsterdam, MD  Received his chest film as below-message to patient  DG Chest 2 View  Result Date: 02/12/2020 CLINICAL DATA:  Chest pain. EXAM: CHEST - 2 VIEW COMPARISON:  None. FINDINGS: The heart size and mediastinal contours are within normal limits. Both lungs are clear. No pneumothorax or pleural effusion is noted. The visualized skeletal structures are unremarkable. IMPRESSION: No active cardiopulmonary disease. Electronically Signed   By: Lupita Raider M.D.   On: 02/12/2020 15:47    Received his labs 12/30- message to pt  Results for orders placed or performed in visit on 02/12/20  CBC  Result Value Ref Range   WBC 6.2 4.0 - 10.5 K/uL   RBC 4.51 4.22 - 5.81 Mil/uL   Platelets 252.0 150.0 - 400.0 K/uL   Hemoglobin 13.8 13.0 - 17.0 g/dL   HCT 18.8 41.6 - 60.6 %   MCV 90.2 78.0 - 100.0 fl   MCHC 33.9 30.0 - 36.0 g/dL   RDW 30.1 60.1 - 09.3 %  C-reactive protein  Result Value Ref Range   CRP <1.0 0.5 - 20.0 mg/dL  Sedimentation rate  Result Value Ref Range   Sed Rate 17 (H) 0 - 15 mm/hr  Comprehensive metabolic panel  Result Value Ref Range   Sodium 138 135 - 145 mEq/L    Potassium 4.1 3.5 - 5.1 mEq/L   Chloride 102 96 - 112 mEq/L   CO2 28 19 - 32 mEq/L   Glucose, Bld 87 70 - 99 mg/dL   BUN 18 6 - 23 mg/dL   Creatinine, Ser 2.35 0.40 - 1.50 mg/dL   Total Bilirubin 0.4 0.2 - 1.2 mg/dL   Alkaline Phosphatase 51 39 - 117 U/L   AST 18 0 - 37 U/L   ALT 18 0 - 53 U/L   Total Protein 7.8 6.0 - 8.3 g/dL   Albumin 5.0 3.5 - 5.2 g/dL   GFR 573.22 >02.54 mL/min   Calcium 9.5 8.4 - 10.5  mg/dL  Troponin I (High Sensitivity)  Result Value Ref Range   High Sens Troponin I 5 2 - 17 ng/L

## 2020-02-12 ENCOUNTER — Encounter: Payer: Self-pay | Admitting: Family Medicine

## 2020-02-12 ENCOUNTER — Ambulatory Visit (INDEPENDENT_AMBULATORY_CARE_PROVIDER_SITE_OTHER): Payer: BC Managed Care – PPO | Admitting: Family Medicine

## 2020-02-12 ENCOUNTER — Other Ambulatory Visit: Payer: Self-pay

## 2020-02-12 ENCOUNTER — Ambulatory Visit
Admission: RE | Admit: 2020-02-12 | Discharge: 2020-02-12 | Disposition: A | Discharge status: Home / Self Care | Payer: BC Managed Care – PPO | Source: Ambulatory Visit | Attending: Family Medicine | Admitting: Family Medicine

## 2020-02-12 VITALS — BP 132/84 | HR 87 | Resp 17 | Ht 68.0 in | Wt 150.0 lb

## 2020-02-12 DIAGNOSIS — R079 Chest pain, unspecified: Secondary | ICD-10-CM | POA: Diagnosis not present

## 2020-02-12 LAB — TROPONIN I (HIGH SENSITIVITY): High Sens Troponin I: 5 ng/L (ref 2–17)

## 2020-02-12 MED ORDER — IBUPROFEN 600 MG PO TABS: 600.0000 mg | ORAL_TABLET | Freq: Three times a day (TID) | ORAL | 0 refills | Status: DC | PRN

## 2020-02-12 MED ORDER — COLCHICINE 0.6 MG PO TABS: ORAL_TABLET | ORAL | 0 refills | Status: DC

## 2020-02-13 ENCOUNTER — Encounter: Payer: Self-pay | Admitting: Family Medicine

## 2020-02-13 LAB — COMPREHENSIVE METABOLIC PANEL
ALT: 18 U/L (ref 0–53)
AST: 18 U/L (ref 0–37)
Albumin: 5 g/dL (ref 3.5–5.2)
Alkaline Phosphatase: 51 U/L (ref 39–117)
BUN: 18 mg/dL (ref 6–23)
CO2: 28 mEq/L (ref 19–32)
Calcium: 9.5 mg/dL (ref 8.4–10.5)
Chloride: 102 mEq/L (ref 96–112)
Creatinine, Ser: 0.75 mg/dL (ref 0.40–1.50)
GFR: 122.58 mL/min (ref >60.00)
Glucose, Bld: 87 mg/dL (ref 70–99)
Potassium: 4.1 mEq/L (ref 3.5–5.1)
Sodium: 138 mEq/L (ref 135–145)
Total Bilirubin: 0.4 mg/dL (ref 0.2–1.2)
Total Protein: 7.8 g/dL (ref 6.0–8.3)

## 2020-02-13 LAB — CBC
HCT: 40.7 % (ref 39.0–52.0)
Hemoglobin: 13.8 g/dL (ref 13.0–17.0)
MCHC: 33.9 g/dL (ref 30.0–36.0)
MCV: 90.2 fl (ref 78.0–100.0)
Platelets: 252 10*3/uL (ref 150.0–400.0)
RBC: 4.51 Mil/uL (ref 4.22–5.81)
RDW: 13.1 % (ref 11.5–15.5)
WBC: 6.2 10*3/uL (ref 4.0–10.5)

## 2020-02-13 LAB — SEDIMENTATION RATE: Sed Rate: 17 mm/hr — ABNORMAL HIGH (ref 0–15)

## 2020-02-13 LAB — C-REACTIVE PROTEIN: CRP: <1 mg/dL (ref 0.5–20.0)

## 2020-02-18 ENCOUNTER — Telehealth: Payer: Self-pay | Admitting: Family Medicine

## 2020-02-18 ENCOUNTER — Encounter: Payer: Self-pay | Admitting: Family Medicine

## 2020-02-18 NOTE — Telephone Encounter (Signed)
Called pt- no answer and VM is full Message to pt on mychart

## 2020-02-18 NOTE — Telephone Encounter (Signed)
Patient states he received the Covid 19 booster, Last  Monday and spoke with Dr Patsy Lager in reference to symptoms he is having after vaccine . Patient states he is still having left sided chest inflammation pain. Patient told to call us to give update.

## 2020-02-24 ENCOUNTER — Telehealth: Payer: Self-pay | Admitting: Family Medicine

## 2020-02-24 ENCOUNTER — Encounter: Payer: Self-pay | Admitting: Family Medicine

## 2020-02-24 DIAGNOSIS — R079 Chest pain, unspecified: Secondary | ICD-10-CM

## 2020-02-24 NOTE — Addendum Note (Signed)
Addended by: Abbe Amsterdam C on: 02/24/2020 05:45 PM   Modules accepted: Orders

## 2020-02-24 NOTE — Telephone Encounter (Signed)
Patient calls to give you an update on chest pain. Patient states' pain has not changed since booster 2 weeks ago. He would like to know if you want to see him or cardiology?

## 2020-02-25 NOTE — Progress Notes (Signed)
[facility] [address], [address] [place], [place] [address] [phone]  Date:  02/26/2020   Name:  Brian Jones   DOB:  [birth year]   MRN:  [id number]  PCP:  Pearline Cables, MD    Chief Complaint: Chest Pain (Follow up, stinging in chest, no sob)   History of Present Illness:  Sincere Liuzzi is a 30 y.o. very pleasant male patient who presents with the following:  Patient here today to follow-up on chest pain Generally healthy young man, seen by myself on December 29-time he had dull, intermittent chest pain which he thought might be due to a recent COVID-19 booster.  He had similar symptoms after he took his second dose of the primary series  EKG, chest film, other labs reassuring.  Slightly elevated sed rate 17  Patient contacted me due to continued chest pain, we plan to repeat sedimentation rate and EKG today Can also consider D-dimer- we discussed this today in detail,  The pt elected NOT do do D dimer test at this time   2-3 days ago the pain started to feel like a "stinging" pain- it had been more dull like a muscle pull He is seeing cardiology tomorrow- [facility] in [place]  He sometimes feels a pressure at the center of his chest  The pain is really constant at this time  No SOB He tried doing some yoga this did not seem to worsen his sx at all  He normally does more of high intensity training He is not sure if duration of pain is why this is getting worse   He is taking ibuprofen twice a day- unsure how much this is helping him   Patient Active Problem List   Diagnosis Date Noted  . Cold sore 09/23/2015    Past Medical History:  Diagnosis Date  . Chicken pox   . Recurrent cold sores     Past Surgical History:  Procedure Laterality Date  . Reconstructive ankle surgery  2016  . WISDOM TOOTH EXTRACTION      Social History   Tobacco Use  . Smoking status: Never Smoker  . Smokeless tobacco: Never  Used  Substance Use Topics  . Alcohol use: Yes    Alcohol/week: 6.0 standard drinks    Types: 6 Cans of beer per week    Family History  Problem Relation Age of Onset  . Healthy Mother   . Healthy Father   . Healthy Sister   . Healthy Brother   . Alzheimer's disease Maternal Grandmother     Allergies  Allergen Reactions  . Amoxicillin Hives    Medication list has been reviewed and updated.  Current Outpatient Medications on File Prior to Visit  Medication Sig Dispense Refill  . colchicine 0.6 MG tablet Take one daily as directed 30 tablet 0  . ibuprofen (ADVIL) 600 MG tablet Take 1 tablet (600 mg total) by mouth every 8 (eight) hours as needed. 60 tablet 0  . valACYclovir (VALTREX) 1000 MG tablet For cold sore outbreak: take 2 pills, then repeat 2 pills 12 hours later 20 tablet 0   No current facility-administered medications on file prior to visit.    Review of Systems:  As per HPI- otherwise negative.   Physical Examination: Vitals:   02/26/20 1452  BP: 122/80  Pulse: 70  Resp: 16  SpO2: 98%   Vitals:   02/26/20 1452  Weight: 150 lb (68 kg)  Height: 5\' 8"  (1.727 m)   Body mass index is 22.81 kg/m. Ideal Body Weight: Weight in (lb) to have BMI = 25: 164.1  GEN: no acute distress.  Normal weight, looks well HEENT: Atraumatic, Normocephalic.  Ears and Nose: No external deformity. CV: RRR, No M/G/R. No JVD. No thrill. No extra heart sounds. PULM: CTA B, no wheezes, crackles, rhonchi. No retractions. No resp. distress. No accessory muscle use. ABD: S, NT, ND, +BS. No rebound. No HSM. EXTR: No c/c/e PSYCH: Normally interactive. Conversant.   EKG- no change from previous, NSR Assessment and Plan: Chest pain, unspecified type - Plan: EKG 12-Lead, Sedimentation rate, CANCELED: Sedimentation rate  Pt here today with continued chest pain This may be some side effect or complication of the covid 19 vaccine Referral to cardiology - he is being seen  tomorrow EKG is stable and normal   Will recheck sed rate today- normal, pt alerted  Results for orders placed or performed in visit on 02/26/20  Sedimentation rate  Result Value Ref Range   Sed Rate 7 0 - 15 mm/hr     This visit occurred during the SARS-CoV-2 public health emergency.  Safety protocols were in place, including screening questions prior to the visit, additional usage of staff PPE, and extensive cleaning of exam room while observing appropriate contact time as indicated for disinfecting solutions.    Signed 04/25/20, MD

## 2020-02-26 ENCOUNTER — Ambulatory Visit: Payer: BC Managed Care – PPO | Admitting: Family Medicine

## 2020-02-26 ENCOUNTER — Other Ambulatory Visit: Payer: Self-pay

## 2020-02-26 ENCOUNTER — Encounter: Payer: Self-pay | Admitting: Family Medicine

## 2020-02-26 VITALS — BP 122/80 | HR 70 | Resp 16 | Ht 68.0 in | Wt 150.0 lb

## 2020-02-26 DIAGNOSIS — R079 Chest pain, unspecified: Secondary | ICD-10-CM | POA: Diagnosis not present

## 2020-02-26 LAB — SEDIMENTATION RATE: Sed Rate: 7 mm/hr (ref 0–15)

## 2020-02-26 NOTE — Patient Instructions (Signed)
Good to see you again today- I am glad you are seeing cardiology Please let me know what they say and I will be in touch with your sed rate asap

## 2020-02-27 DIAGNOSIS — R079 Chest pain, unspecified: Secondary | ICD-10-CM | POA: Diagnosis not present

## 2020-02-28 ENCOUNTER — Ambulatory Visit: Payer: BC Managed Care – PPO | Admitting: Cardiology

## 2020-03-04 DIAGNOSIS — R079 Chest pain, unspecified: Secondary | ICD-10-CM | POA: Diagnosis not present

## 2020-11-30 ENCOUNTER — Other Ambulatory Visit: Payer: Self-pay

## 2020-11-30 ENCOUNTER — Ambulatory Visit: Payer: BC Managed Care – PPO | Admitting: Family Medicine

## 2020-11-30 VITALS — BP 142/80 | HR 75 | Temp 98.4°F | Resp 18 | Ht 60.0 in | Wt 152.8 lb

## 2020-11-30 DIAGNOSIS — R0789 Other chest pain: Secondary | ICD-10-CM

## 2020-11-30 DIAGNOSIS — R030 Elevated blood-pressure reading, without diagnosis of hypertension: Secondary | ICD-10-CM | POA: Diagnosis not present

## 2020-11-30 MED ORDER — PREDNISONE 20 MG PO TABS: ORAL_TABLET | ORAL | 0 refills | Status: DC

## 2020-11-30 NOTE — Progress Notes (Signed)
[facility] [address] [place], [place] [address] [phone]  Date:  11/30/2020   Name:  Brian Jones   DOB:  [birth year]   MRN:  [id number]  PCP:  Pearline Cables, MD    Chief Complaint: follow up on chest pain (Since having and/or being exposed to covid. Has seen Cardiology. )   History of Present Illness:  Brian Jones is a 30 y.o. very pleasant male patient who presents with the following:  Generally healthy young man, here today with concern of recurrent chest pain Last seen by myself in January when he was having CP - he was seen by cardiology on 02/28/20- they recommended an echo which was normal  Left Ventricle: Systolic function is normal. EF: 60%.    Left Ventricle: Doppler parameters indicate normal diastolic function.    Left Ventricle: Wall motion is normal.    Tricuspid Valve: The right ventricular systolic pressure is normal (<36  mmHg).  No significant valvular disease noted.   The CP did seem to start after he took his covid booster (he had similar sx after his 2nd primary series dose)  Cardiology felt that his heart was ok  He did get covid in March/ April- the chest pain flared up again at that time, then seemed to resolve. [place] and "everyone" got covid in July-he did not test positive again at that time, but the CP seemed to flare up Quieted down down, then the pain flared again over the last week or so- covid cases are up at his job. He tested himself and was negative  He feels like the chest pain will seem to worsen every time either has a COVID-vaccine, has COVID illness, or if there is an uptake in community COVID near him.  Not coughing  The pain does not get worse with exercise-he continues to do fairly intense gym workouts without any change in his exercise abilities His breathing is normal   Patient Active Problem List   Diagnosis Date Noted   Cold  sore 09/23/2015    Past Medical History:  Diagnosis Date   Chicken pox    Recurrent cold sores     Past Surgical History:  Procedure Laterality Date   Reconstructive ankle surgery  2016   WISDOM TOOTH EXTRACTION      Social History   Tobacco Use   Smoking status: Never   Smokeless tobacco: Never  Substance Use Topics   Alcohol use: Yes    Alcohol/week: 6.0 standard drinks    Types: 6 Cans of beer per week    Family History  Problem Relation Age of Onset   Healthy Mother    Healthy Father    Healthy Sister    Healthy Brother    Alzheimer's disease Maternal Grandmother     Allergies  Allergen Reactions   Amoxicillin Hives    Medication list has been reviewed and updated.  Current Outpatient Medications on File Prior to Visit  Medication Sig Dispense Refill   valACYclovir (VALTREX) 1000 MG tablet For cold sore outbreak: take 2 pills, then repeat 2 pills 12 hours later (Patient taking differently: as needed. For cold sore outbreak: take 2 pills, then repeat 2 pills 12 hours later) 20 tablet 0   No current facility-administered medications on file prior to visit.    Review of Systems:  As per HPI- otherwise negative.   Physical Examination:  Vitals:   11/30/20 1558  BP: (!) 142/80  Pulse: 75  Resp: 18  Temp: 98.4 F (36.9 C)  SpO2: 100%   Vitals:   11/30/20 1558  Weight: 152 lb 12.8 oz (69.3 kg)  Height: 5' (1.524 m)   Body mass index is 29.84 kg/m. Ideal Body Weight: Weight in (lb) to have BMI = 25: 127.7  GEN: no acute distress.  Normal weight, looks well HEENT: Atraumatic, Normocephalic.  Ears and Nose: No external deformity. CV: RRR, No M/G/R. No JVD. No thrill. No extra heart sounds. PULM: CTA B, no wheezes, crackles, rhonchi. No retractions. No resp. distress. No accessory muscle use. ABD: S, NT, ND, +BS. No rebound. No HSM. EXTR: No c/c/e PSYCH: Normally interactive. Conversant.  I am not able to reproduce his chest pain by pressing on  the chest wall today.  He notes his pain is typically over the left chest  BP Readings from Last 3 Encounters:  11/30/20 (!) 142/80  02/26/20 122/80  02/12/20 132/84    Assessment and Plan: Chest wall pain - Plan: predniSONE (DELTASONE) 20 MG tablet  Elevated blood pressure reading  Patient seen today with concern of recurrent chest pain.  It has followed an unusual pattern as described above.  He has been seen by cardiology, chest pain is thought to be noncardiac' We wonder if the pain may be due to inflammation, and chest wall pain.  We will try a course of prednisone.  He will let me know if this is helpful Blood pressure is a bit high today, likely due to anxiety.  He is able to check his blood pressure at work, will get a few readings for me and report back  Signed Abbe Amsterdam, MD

## 2020-11-30 NOTE — Patient Instructions (Signed)
It was good to see you today- please let me know if the prednisone is helpful for you at all  Please have your nurse at work check your BP at couple of times next week- it if continues to run high please let me know

## 2020-12-01 ENCOUNTER — Encounter: Payer: Self-pay | Admitting: Family Medicine

## 2020-12-01 DIAGNOSIS — R079 Chest pain, unspecified: Secondary | ICD-10-CM

## 2020-12-01 DIAGNOSIS — R0789 Other chest pain: Secondary | ICD-10-CM

## 2020-12-01 DIAGNOSIS — R030 Elevated blood-pressure reading, without diagnosis of hypertension: Secondary | ICD-10-CM

## 2021-01-22 ENCOUNTER — Encounter: Payer: Self-pay | Admitting: Interventional Cardiology

## 2021-01-22 ENCOUNTER — Other Ambulatory Visit: Payer: Self-pay

## 2021-01-22 ENCOUNTER — Ambulatory Visit (INDEPENDENT_AMBULATORY_CARE_PROVIDER_SITE_OTHER): Payer: BC Managed Care – PPO | Admitting: Interventional Cardiology

## 2021-01-22 VITALS — BP 144/86 | HR 82 | Ht 68.0 in | Wt 155.4 lb

## 2021-01-22 DIAGNOSIS — R072 Precordial pain: Secondary | ICD-10-CM | POA: Diagnosis not present

## 2021-01-22 DIAGNOSIS — R030 Elevated blood-pressure reading, without diagnosis of hypertension: Secondary | ICD-10-CM | POA: Diagnosis not present

## 2021-01-22 NOTE — Patient Instructions (Signed)
Medication Instructions:  Your physician recommends that you continue on your current medications as directed. Please refer to the Current Medication list given to you today.  *If you need a refill on your cardiac medications before your next appointment, please call your pharmacy*   Lab Work: none If you have labs (blood work) drawn today and your tests are completely normal, you will receive your results only by: MyChart Message (if you have MyChart) OR A paper copy in the mail If you have any lab test that is abnormal or we need to change your treatment, we will call you to review the results.   Testing/Procedures: none   Follow-Up: At [facility], you and your health needs are our priority.  As part of our continuing mission to provide you with exceptional heart care, we have created designated Provider Care Teams.  These Care Teams include your primary Cardiologist (physician) and Advanced Practice Providers (APPs -  Physician Assistants and Nurse Practitioners) who all work together to provide you with the care you need, when you need it.  We recommend signing up for the patient portal called "MyChart".  Sign up information is provided on this After Visit Summary.  MyChart is used to connect with patients for Virtual Visits (Telemedicine).  Patients are able to view lab/test results, encounter notes, upcoming appointments, etc.  Non-urgent messages can be sent to your provider as well.   To learn more about what you can do with MyChart, go to [email or web address].    Your next appointment:   June 2,2023 at 8:00  The format for your next appointment:   In Person  Provider:   Lance Muss, MD     Other Instructions  Low-Sodium Eating Plan Sodium, which is an element that makes up salt, helps you maintain a healthy balance of fluids in your body. Too much sodium can increase your blood pressure and cause fluid and waste to be held in your body. Your health care  provider or dietitian may recommend following this plan if you have high blood pressure (hypertension), kidney disease, liver disease, or heart failure. Eating less sodium can help lower your blood pressure, reduce swelling, and protect your heart, liver, and kidneys. What are tips for following this plan? Reading food labels The Nutrition Facts label lists the amount of sodium in one serving of the food. If you eat more than one serving, you must multiply the listed amount of sodium by the number of servings. Choose foods with less than 140 mg of sodium per serving. Avoid foods with 300 mg of sodium or more per serving. Shopping  Look for lower-sodium products, often labeled as "low-sodium" or "no salt added." Always check the sodium content, even if foods are labeled as "unsalted" or "no salt added." Buy fresh foods. Avoid canned foods and pre-made or frozen meals. Avoid canned, cured, or processed meats. Buy breads that have less than 80 mg of sodium per slice. Cooking  Eat more home-cooked food and less restaurant, buffet, and fast food. Avoid adding salt when cooking. Use salt-free seasonings or herbs instead of table salt or sea salt. Check with your health care provider or pharmacist before using salt substitutes. Cook with plant-based oils, such as canola, sunflower, or olive oil. Meal planning When eating at a restaurant, ask that your food be prepared with less salt or no salt, if possible. Avoid dishes labeled as brined, pickled, cured, smoked, or made with soy sauce, miso, or teriyaki sauce. Avoid foods that contain  MSG (monosodium glutamate). MSG is sometimes added to [place] food, bouillon, and some canned foods. Make meals that can be grilled, baked, poached, roasted, or steamed. These are generally made with less sodium. General information Most people on this plan should limit their sodium intake to 1,500-2,000 mg (milligrams) of sodium each day. What foods should I  eat? Fruits Fresh, frozen, or canned fruit. Fruit juice. Vegetables Fresh or frozen vegetables. "No salt added" canned vegetables. "No salt added" tomato sauce and paste. Low-sodium or reduced-sodium tomato and vegetable juice. Grains Low-sodium cereals, including oats, puffed wheat and rice, and shredded wheat. Low-sodium crackers. Unsalted rice. Unsalted pasta. Low-sodium bread. Whole-grain breads and whole-grain pasta. Meats and other proteins Fresh or frozen (no salt added) meat, poultry, seafood, and fish. Low-sodium canned tuna and salmon. Unsalted nuts. Dried peas, beans, and lentils without added salt. Unsalted canned beans. Eggs. Unsalted nut butters. Dairy Milk. Soy milk. Cheese that is naturally low in sodium, such as ricotta cheese, fresh mozzarella, or Swiss cheese. Low-sodium or reduced-sodium cheese. Cream cheese. Yogurt. Seasonings and condiments Fresh and dried herbs and spices. Salt-free seasonings. Low-sodium mustard and ketchup. Sodium-free salad dressing. Sodium-free light mayonnaise. Fresh or refrigerated horseradish. Lemon juice. Vinegar. Other foods Homemade, reduced-sodium, or low-sodium soups. Unsalted popcorn and pretzels. Low-salt or salt-free chips. The items listed above may not be a complete list of foods and beverages you can eat. Contact a dietitian for more information. What foods should I avoid? Vegetables Sauerkraut, pickled vegetables, and relishes. Olives. [place] fries. Onion rings. Regular canned vegetables (not low-sodium or reduced-sodium). Regular canned tomato sauce and paste (not low-sodium or reduced-sodium). Regular tomato and vegetable juice (not low-sodium or reduced-sodium). Frozen vegetables in sauces. Grains Instant hot cereals. Bread stuffing, pancake, and biscuit mixes. Croutons. Seasoned rice or pasta mixes. Noodle soup cups. Boxed or frozen macaroni and cheese. Regular salted crackers. Self-rising flour. Meats and other proteins Meat or  fish that is salted, canned, smoked, spiced, or pickled. Precooked or cured meat, such as sausages or meat loaves. [place] Salines. Ham. Pepperoni. Hot dogs. Corned beef. Chipped beef. Salt pork. Jerky. Pickled herring. Anchovies and sardines. Regular canned tuna. Salted nuts. Dairy Processed cheese and cheese spreads. Hard cheeses. Cheese curds. Blue cheese. Feta cheese. String cheese. Regular cottage cheese. Buttermilk. Canned milk. Fats and oils Salted butter. Regular margarine. Ghee. Bacon fat. Seasonings and condiments Onion salt, garlic salt, seasoned salt, table salt, and sea salt. Canned and packaged gravies. Worcestershire sauce. Tartar sauce. Barbecue sauce. Teriyaki sauce. Soy sauce, including reduced-sodium. Steak sauce. Fish sauce. Oyster sauce. Cocktail sauce. Horseradish that you find on the shelf. Regular ketchup and mustard. Meat flavorings and tenderizers. Bouillon cubes. Hot sauce. Pre-made or packaged marinades. Pre-made or packaged taco seasonings. Relishes. Regular salad dressings. Salsa. Other foods Salted popcorn and pretzels. Corn chips and puffs. Potato and tortilla chips. Canned or dried soups. Pizza. Frozen entrees and pot pies. The items listed above may not be a complete list of foods and beverages you should avoid. Contact a dietitian for more information. Summary Eating less sodium can help lower your blood pressure, reduce swelling, and protect your heart, liver, and kidneys. Most people on this plan should limit their sodium intake to 1,500-2,000 mg (milligrams) of sodium each day. Canned, boxed, and frozen foods are high in sodium. Restaurant foods, fast foods, and pizza are also very high in sodium. You also get sodium by adding salt to food. Try to cook at home, eat more fresh fruits and vegetables, and eat  less fast food and canned, processed, or prepared foods. This information is not intended to replace advice given to you by your health care provider. Make sure you  discuss any questions you have with your health care provider. Document Revised: 03/08/2019 Document Reviewed: 01/02/2019 Elsevier Patient Education  2022 [employer].

## 2021-01-22 NOTE — Progress Notes (Signed)
Cardiology Office Note   Date:  01/22/2021   ID:  Brian Jones, DOB 24-Jan-1991, MRN [id number]  PCP:  Brian Mclean, MD    No chief complaint on file.    Wt Readings from Last 3 Encounters:  01/22/21 155 lb 6.4 oz (70.5 kg)  11/30/20 152 lb 12.8 oz (69.3 kg)  02/26/20 150 lb (68 kg)       History of Present Illness: Brian Jones is a 30 y.o. male  who is very active.  He ran a half marathon in Nov 2022.  Overall , he feels well.    In December 2021, got a [place] booster.  Had chest pain the next day.  Had a lab test showing inflammation which resolved 2 weeks later.  Had a normal echo with a cardiologist.  The chest pain was constant and resolved over 2 months.   Jan 2022 echo: "Left Ventricle  Normal left ventricular size. Wall thickness is normal. Systolic function is normal. EF: 60%. Wall motion is normal. Doppler parameters indicate normal diastolic function.   Right Ventricle  Right ventricle is normal. Systolic function is normal.   Left Atrium  Left atrium is normal in size.   Right Atrium  Normal sized right atrium.   IVC/SVC  The inferior vena cava demonstrates a diameter of <=2.1 cm and collapses >50%; therefore, the right atrial pressure is estimated at 3 mmHg.   Mitral Valve  Mitral valve structure is normal. The leaflets are not thickened and exhibit normal excursion. There is trace regurgitation. There is no evidence of mitral valve stenosis.   Tricuspid Valve  Tricuspid valve structure is normal. The leaflets are not thickened and exhibit normal excursion. There is trace regurgitation. TR jet velocity 2.2 m/s. There is no evidence of tricuspid valve stenosis. The right ventricular systolic pressure is normal (<36 mmHg).   Aortic Valve  The aortic valve is tricuspid. The leaflets are not thickened and exhibit normal excursion. There is no regurgitation or stenosis.   Pulmonic Valve  The pulmonic valve was not well visualized. Trace  regurgitation. No pulmonic stenosis present.   Ascending Aorta  The aortic root is normal in size.   Pericardium  There is no pericardial effusion. "  In Feb 2022, he was cleared by a cardiologist that perhaps he had anxiety.  In March 2022, he tested positive for COVID.  He had chest pain and mild flu like sx. He did not take Paxlovid. Flu sx resolved after a week, but chest pain lasted or a few more weeks.  In July, he was exposed to [place] in [place], he did not test positive but chest pain recurred, but not as severe.  In October 2022, he was exposed to COVID positive people, chest pain came back.  More severe initially, but now resolving.   His BP has been a little elevated.  140/90.    Denies : Dizziness. Leg edema. Nitroglycerin use. Orthopnea. Palpitations. Paroxysmal nocturnal dyspnea. Shortness of breath. Syncope.    He remains very active with exercise.  Past Medical History:  Diagnosis Date   Chicken pox    Recurrent cold sores     Past Surgical History:  Procedure Laterality Date   Reconstructive ankle surgery  2016   WISDOM TOOTH EXTRACTION       Current Outpatient Medications  Medication Sig Dispense Refill   valACYclovir (VALTREX) 1000 MG tablet For cold sore outbreak: take 2 pills, then repeat 2 pills 12 hours later (  Patient taking differently: as needed. For cold sore outbreak: take 2 pills, then repeat 2 pills 12 hours later) 20 tablet 0   predniSONE (DELTASONE) 20 MG tablet Take 40 mg daily for 4 days, then 20 mg daily for 4 days (Patient not taking: Reported on 01/22/2021) 12 tablet 0   No current facility-administered medications for this visit.    Allergies:   Amoxicillin    Social History:  The patient  reports that he has never smoked. He has never used smokeless tobacco. He reports current alcohol use of about 6.0 standard drinks per week.   Family History:  The patient's family history includes Alzheimer's disease in his maternal  grandmother; Healthy in his brother, father, mother, and sister.    ROS:  Please see the history of present illness.   Otherwise, review of systems are positive for recurrent, episodic chest pain when exposed to COVID.   All other systems are reviewed and negative.    PHYSICAL EXAM: VS:  BP (!) 144/86 (BP Location: Right Arm, Patient Position: Sitting, Cuff Size: Normal)   Pulse 82   Ht 5\' 8"  (1.727 m)   Wt 155 lb 6.4 oz (70.5 kg)   SpO2 99%   BMI 23.63 kg/m  , BMI Body mass index is 23.63 kg/m. GEN: Well nourished, well developed, in no acute distress HEENT: normal Neck: no JVD, carotid bruits, or masses Cardiac: RRR; no murmurs, rubs, or gallops,no edema  Respiratory:  clear to auscultation bilaterally, normal work of breathing GI: soft, nontender, nondistended, + BS MS: no deformity or atrophy Skin: warm and dry, no rash Neuro:  Strength and sensation are intact Psych: euthymic mood, full affect   EKG:   The ekg ordered today demonstrates normal sinus rhythm, no ST segment changes   Recent Labs: 02/12/2020: ALT 18; BUN 18; Creatinine, Ser 0.75; Hemoglobin 13.8; Platelets 252.0; Potassium 4.1; Sodium 138   Lipid Panel No results found for: CHOL, TRIG, HDL, CHOLHDL, VLDL, LDLCALC, LDLDIRECT   Other studies Reviewed: Additional studies/ records that were reviewed today with results demonstrating: Records from [facility] reviewed, troponin negative in January 2022.   ASSESSMENT AND PLAN:  Chest pain: Likely some degree of pericarditis.  First episode was after he got COVID booster.  Perhaps, when he is exposed to COVID, it be inflammation reactivates.  Hopefully, this will subside with time.  He can take NSAIDs when that pain comes on.  If he does have another episode, would like to get another echocardiogram and ECG to see if there are changes.  He will let February 2022 know.  No restrictions on his activities. Borderline blood pressure: Asked him to try to check at home.  He has only  checked in the doctor's office and at work and readings are typically in the 140/80 range.  I suspect they will be better when he is more relaxed at home.    Current medicines are reviewed at length with the patient today.  The patient concerns regarding his medicines were addressed.  The following changes have been made:  No change  Labs/ tests ordered today include:  No orders of the defined types were placed in this encounter.   Recommend 150 minutes/week of aerobic exercise Low fat, low carb, high fiber diet recommended  Disposition:   FU in 6 months   Signed, [place], MD  01/22/2021 8:09 AM    [facility] [address] [place], [place], [place]  [place] Phone: [phone]; Fax: [phone]

## 2021-02-26 DIAGNOSIS — D225 Melanocytic nevi of trunk: Secondary | ICD-10-CM | POA: Diagnosis not present

## 2021-02-26 DIAGNOSIS — X32XXXD Exposure to sunlight, subsequent encounter: Secondary | ICD-10-CM | POA: Diagnosis not present

## 2021-02-26 DIAGNOSIS — L570 Actinic keratosis: Secondary | ICD-10-CM | POA: Diagnosis not present

## 2021-02-26 DIAGNOSIS — Z1283 Encounter for screening for malignant neoplasm of skin: Secondary | ICD-10-CM | POA: Diagnosis not present

## 2021-02-26 DIAGNOSIS — D485 Neoplasm of uncertain behavior of skin: Secondary | ICD-10-CM | POA: Diagnosis not present

## 2021-03-12 DIAGNOSIS — D485 Neoplasm of uncertain behavior of skin: Secondary | ICD-10-CM | POA: Diagnosis not present

## 2021-03-12 DIAGNOSIS — L988 Other specified disorders of the skin and subcutaneous tissue: Secondary | ICD-10-CM | POA: Diagnosis not present

## 2021-07-13 IMAGING — CR DG CHEST 2V
2 series · 2 of 2 positions shown · non-contrast
Comparison: None.

CLINICAL DATA: Chest pain.

EXAM:
CHEST - 2 VIEW

[w chest pa]
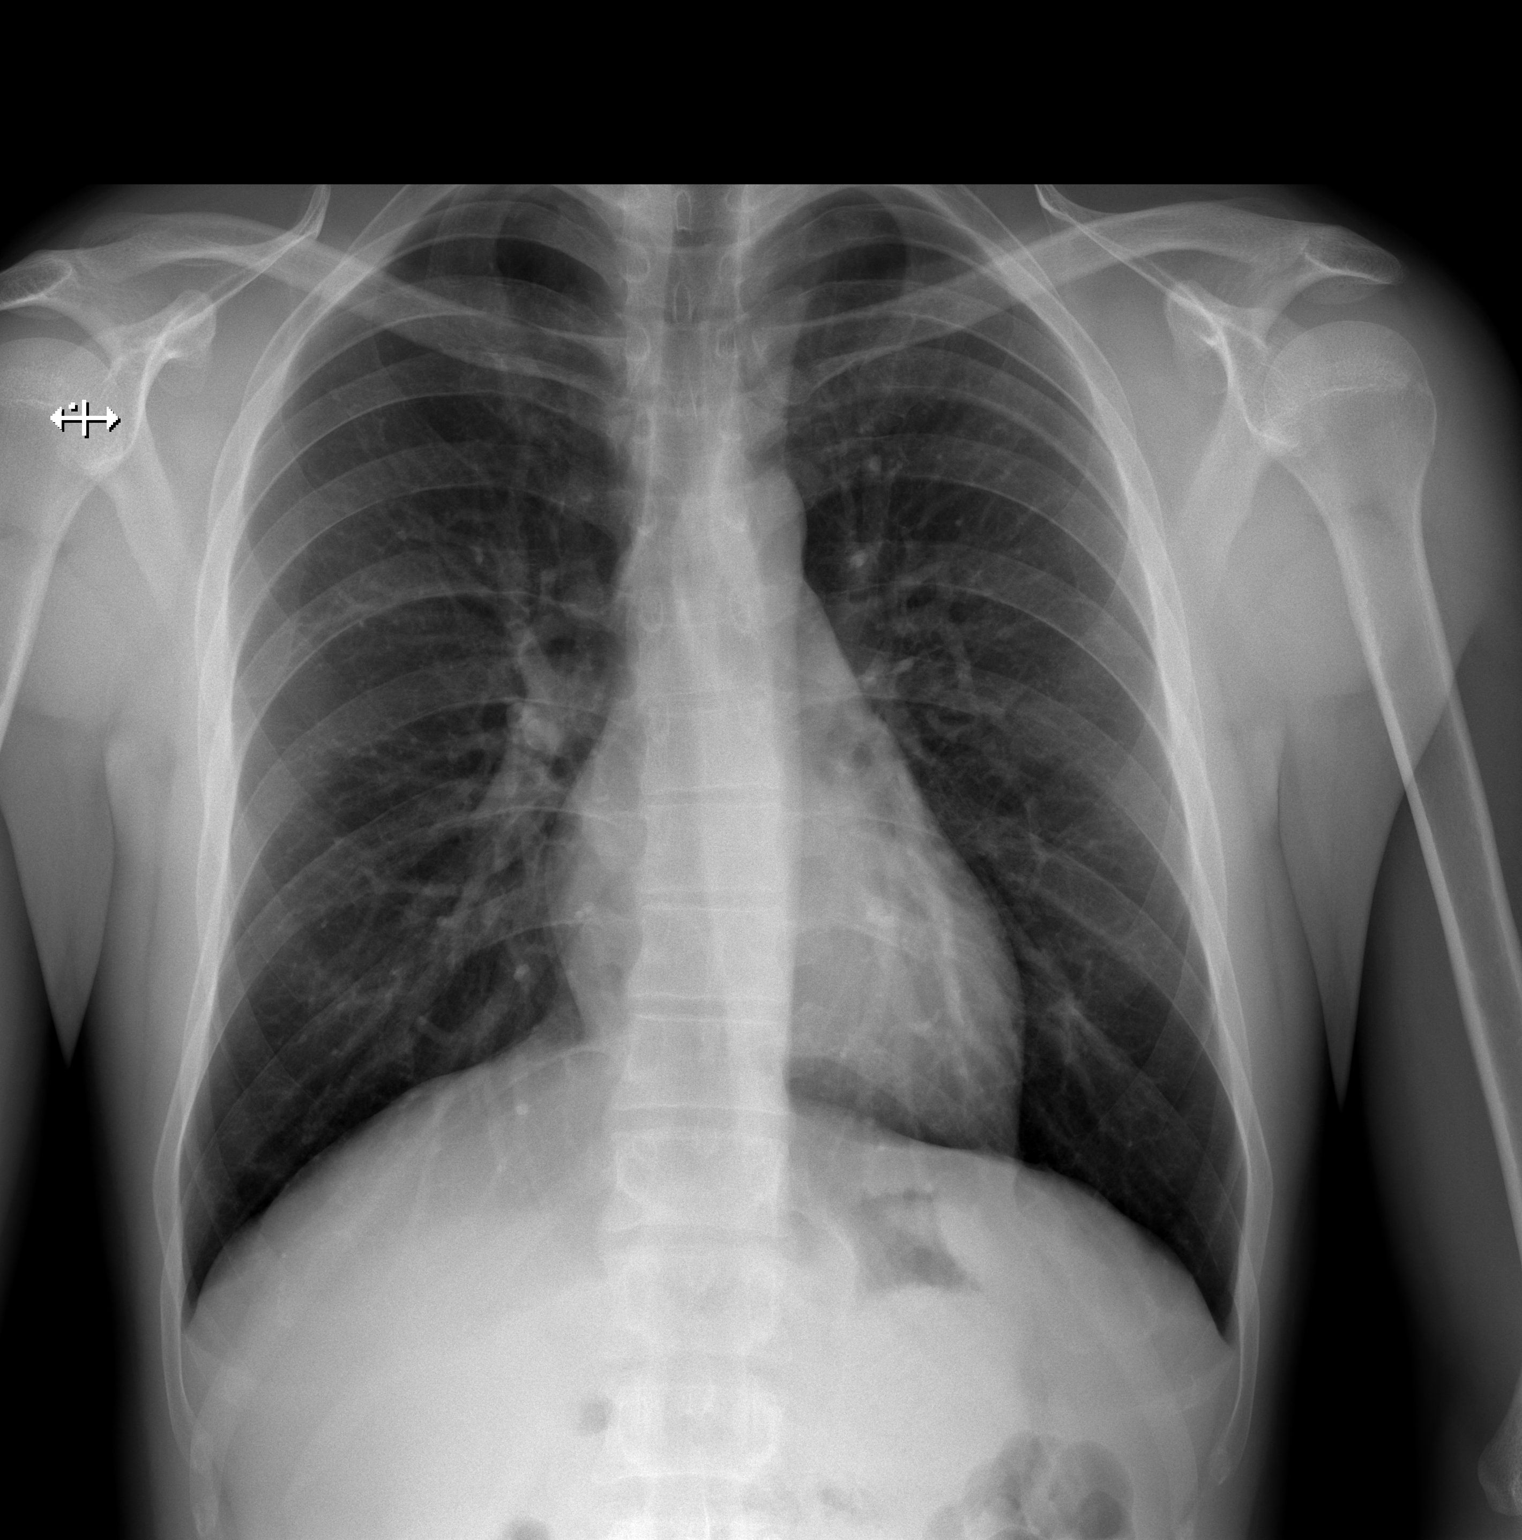

[w chest lat]
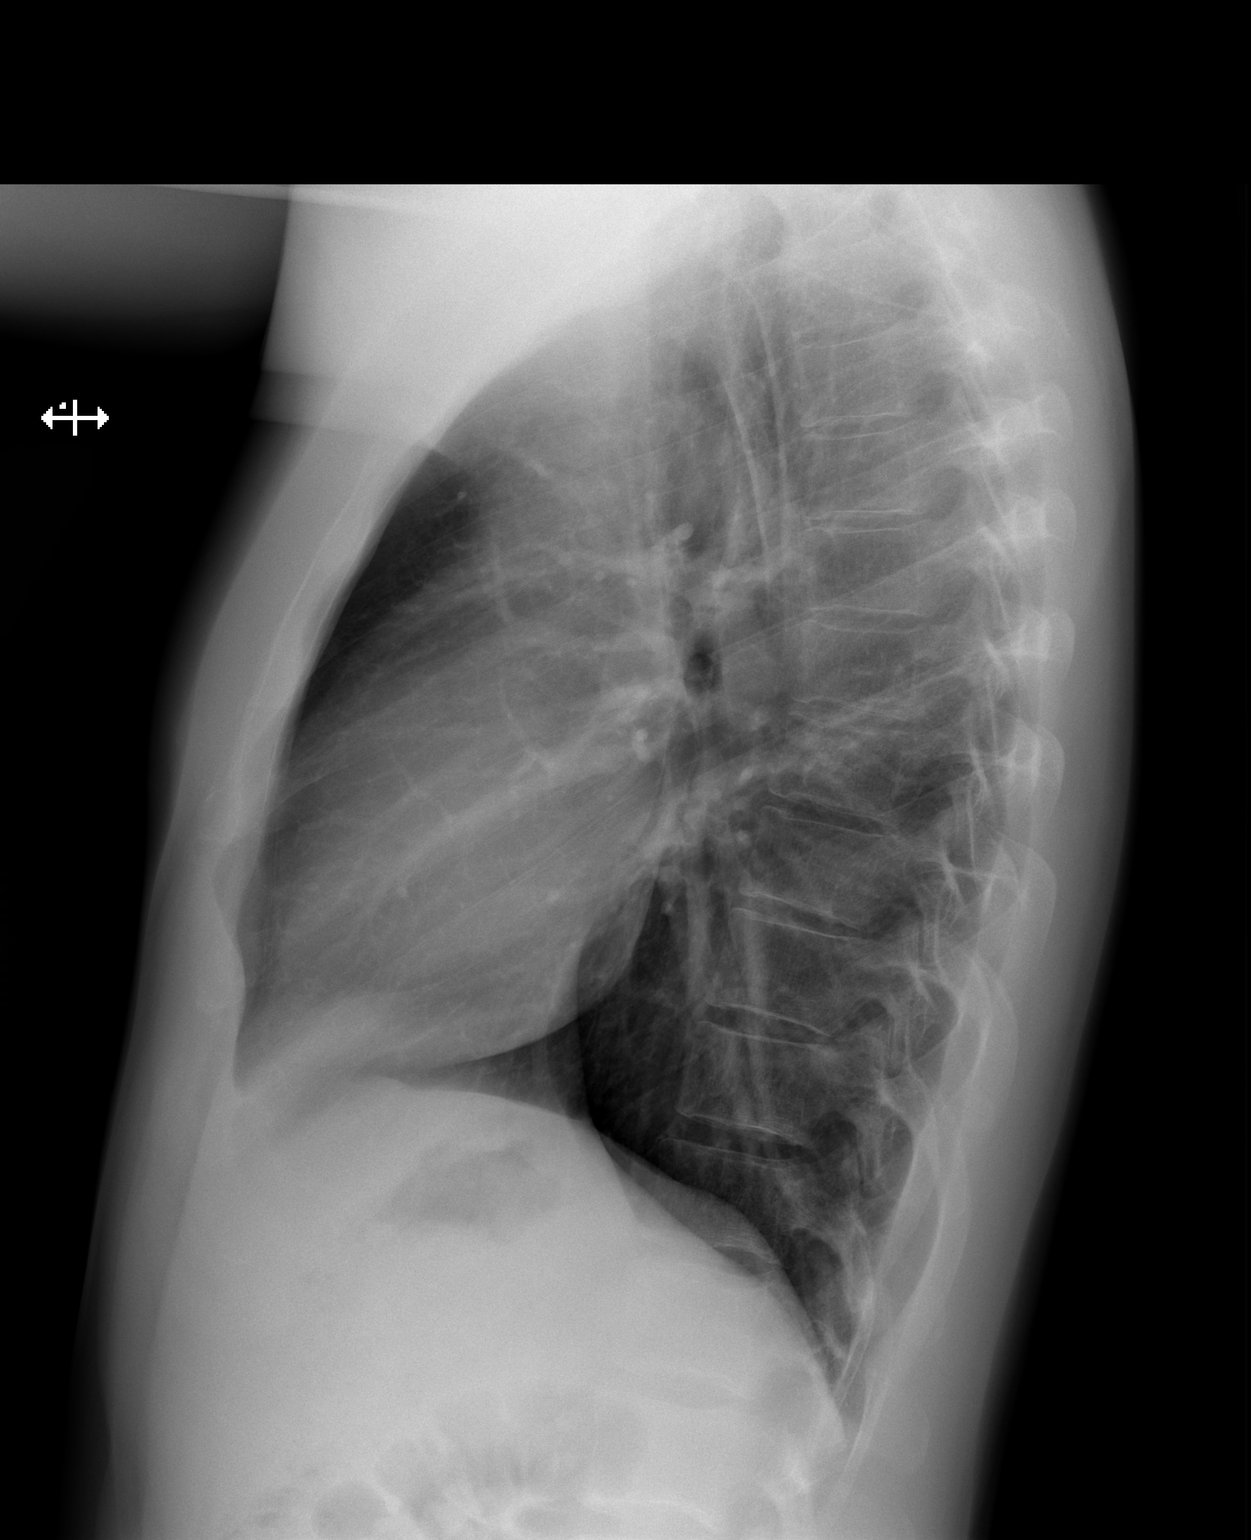

[2 of 2 positions shown; findings below may reference images not displayed]

FINDINGS: The heart size and mediastinal contours are within normal limits.
Both lungs are clear. No pneumothorax or pleural effusion is noted.
The visualized skeletal structures are unremarkable.
IMPRESSION: No active cardiopulmonary disease.

## 2021-07-15 NOTE — Progress Notes (Unsigned)
Cardiology Office Note   Date:  07/16/2021   ID:  Brian Jones, DOB September 26, 1990, MRN [id number]  PCP:  Pearline Cables, MD    No chief complaint on file.  Chest pain  Wt Readings from Last 3 Encounters:  07/16/21 155 lb (70.3 kg)  01/22/21 155 lb 6.4 oz (70.5 kg)  11/30/20 152 lb 12.8 oz (69.3 kg)       History of Present Illness: Brian Jones is a 31 y.o. male  who is very active.  He ran a half marathon in Nov 2022.  Overall , he feels well.     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  Had chest pain the next day.  Had a lab test showing inflammation which resolved 2 weeks later.  Had a normal echo with a cardiologist.  The chest pain was constant and resolved over 2 months.    Jan 2022 echo: "Left Ventricle  Normal left ventricular size. Wall thickness is normal. Systolic function is normal. EF: 60%. Wall motion is normal. Doppler parameters indicate normal diastolic function.   Right Ventricle  Right ventricle is normal. Systolic function is normal.   Left Atrium  Left atrium is normal in size.   Right Atrium  Normal sized right atrium.   IVC/SVC  The inferior vena cava demonstrates a diameter of <=2.1 cm and collapses >50%; therefore, the right atrial pressure is estimated at 3 mmHg.   Mitral Valve  Mitral valve structure is normal. The leaflets are not thickened and exhibit normal excursion. There is trace regurgitation. There is no evidence of mitral valve stenosis.   Tricuspid Valve  Tricuspid valve structure is normal. The leaflets are not thickened and exhibit normal excursion. There is trace regurgitation. TR jet velocity 2.2 m/s. There is no evidence of tricuspid valve stenosis. The right ventricular systolic pressure is normal (<36 mmHg).   Aortic Valve  The aortic valve is tricuspid. The leaflets are not thickened and exhibit normal excursion. There is no regurgitation or stenosis.   Pulmonic Valve  The pulmonic valve was not well  visualized. Trace regurgitation. No pulmonic stenosis present.   Ascending Aorta  The aortic root is normal in size.   Pericardium  There is no pericardial effusion. "   In Feb 2022, he was cleared by a cardiologist that perhaps he had anxiety.   In March 2022, he tested positive for COVID.  He had chest pain and mild flu like sx. He did not take Paxlovid. Flu sx resolved after a week, but chest pain lasted or a few more weeks.   In July, he was exposed to COVID in the [place], he did not test positive but chest pain recurred, but not as severe.   In October 2022, he was exposed to COVID positive people, chest pain came back.  More severe initially, but now resolving.   DOing cardio exercises regularly and light weights.  Occasional sharp stinging pain at his left lower rib cage.  Feels more like muscular pain.  No fevers or pain with inspiration.   Denies : Dizziness. Leg edema. Nitroglycerin use. Orthopnea. Palpitations. Paroxysmal nocturnal dyspnea. Shortness of breath. Syncope.     Past Medical History:  Diagnosis Date   Chicken pox    Recurrent cold sores     Past Surgical History:  Procedure Laterality Date   Reconstructive ankle surgery  2016   WISDOM TOOTH EXTRACTION       Current Outpatient Medications  Medication Sig Dispense  Refill   valACYclovir (VALTREX) 1000 MG tablet For cold sore outbreak: take 2 pills, then repeat 2 pills 12 hours later (Patient taking differently: as needed. For cold sore outbreak: take 2 pills, then repeat 2 pills 12 hours later) 20 tablet 0   No current facility-administered medications for this visit.    Allergies:   Amoxicillin    Social History:  The patient  reports that he has never smoked. He has never used smokeless tobacco. He reports current alcohol use of about 6.0 standard drinks per week.   Family History:  The patient's family history includes Alzheimer's disease in his maternal grandmother; Healthy in his brother,  father, mother, and sister.    ROS:  Please see the history of present illness.   Otherwise, review of systems are positive for chronic left sided chest pain.   All other systems are reviewed and negative.    PHYSICAL EXAM: VS:  BP 130/68   Pulse 80   Ht 5\' 8"  (1.727 m)   Wt 155 lb (70.3 kg)   SpO2 98%   BMI 23.57 kg/m  , BMI Body mass index is 23.57 kg/m. GEN: Well nourished, well developed, in no acute distress HEENT: normal Neck: no JVD, carotid bruits, or masses Cardiac: RRR; no murmurs, rubs, or gallops,no edema  Respiratory:  clear to auscultation bilaterally, normal work of breathing GI: soft, nontender, nondistended, + BS MS: no deformity or atrophy Skin: warm and dry, no rash Neuro:  Strength and sensation are intact Psych: euthymic mood, full affect   EKG:   The ekg ordered 01/2021 demonstrates normal ECG   Recent Labs: No results found for requested labs within last 8760 hours.   Lipid Panel No results found for: CHOL, TRIG, HDL, CHOLHDL, VLDL, LDLCALC, LDLDIRECT   Other studies Reviewed: Additional studies/ records that were reviewed today with results demonstrating: labs from 2021- needs lipid checked.   ASSESSMENT AND PLAN:  Chest pain: chronic on the left side.  Noncardiac in nature.  Sx stable.  Exercising well.  He will let us know if there is a change in sx.  Borderline blood pressure:  Well controlled today. Can be high at work.  Screening for lipoid disorders.  Will check labs.     Current medicines are reviewed at length with the patient today.  The patient concerns regarding his medicines were addressed.  The following changes have been made:  No change  Labs/ tests ordered today include:  No orders of the defined types were placed in this encounter.   Recommend 150 minutes/week of aerobic exercise Low fat, low carb, high fiber diet recommended  Disposition:   FU in 1 year   Signed, Larae Grooms, MD  07/16/2021 8:20 AM    [address] [place], [place], [place]  [address] Phone: [phone]; Fax: [phone]

## 2021-07-16 ENCOUNTER — Ambulatory Visit: Payer: BC Managed Care – PPO | Admitting: Interventional Cardiology

## 2021-07-16 ENCOUNTER — Encounter: Payer: Self-pay | Admitting: Interventional Cardiology

## 2021-07-16 VITALS — BP 130/68 | HR 80 | Ht 68.0 in | Wt 155.0 lb

## 2021-07-16 DIAGNOSIS — R030 Elevated blood-pressure reading, without diagnosis of hypertension: Secondary | ICD-10-CM

## 2021-07-16 DIAGNOSIS — R072 Precordial pain: Secondary | ICD-10-CM

## 2021-07-16 DIAGNOSIS — Z13220 Encounter for screening for lipoid disorders: Secondary | ICD-10-CM

## 2021-07-16 NOTE — Patient Instructions (Signed)
Medication Instructions:  Your physician recommends that you continue on your current medications as directed. Please refer to the Current Medication list given to you today.  *If you need a refill on your cardiac medications before your next appointment, please call your pharmacy*   Lab Work: Your physician recommends that you return for lab work on July 20, 2021.  Lipids, CMET and CBC.  This will be fasting.  The lab opens at 7:15 AM  If you have labs (blood work) drawn today and your tests are completely normal, you will receive your results only by: MyChart Message (if you have MyChart) OR A paper copy in the mail If you have any lab test that is abnormal or we need to change your treatment, we will call you to review the results.   Testing/Procedures: none   Follow-Up: At [facility], you and your health needs are our priority.  As part of our continuing mission to provide you with exceptional heart care, we have created designated Provider Care Teams.  These Care Teams include your primary Cardiologist (physician) and Advanced Practice Providers (APPs -  Physician Assistants and Nurse Practitioners) who all work together to provide you with the care you need, when you need it.  We recommend signing up for the patient portal called "MyChart".  Sign up information is provided on this After Visit Summary.  MyChart is used to connect with patients for Virtual Visits (Telemedicine).  Patients are able to view lab/test results, encounter notes, upcoming appointments, etc.  Non-urgent messages can be sent to your provider as well.   To learn more about what you can do with MyChart, go to [email or web address].    Your next appointment:   12 month(s)  The format for your next appointment:   In Person  Provider:   Lance Muss, MD     Other Instructions   Important Information About Sugar

## 2021-07-20 ENCOUNTER — Other Ambulatory Visit: Payer: BC Managed Care – PPO

## 2021-11-08 DIAGNOSIS — J029 Acute pharyngitis, unspecified: Secondary | ICD-10-CM | POA: Diagnosis not present

## 2022-04-01 ENCOUNTER — Encounter: Payer: Self-pay | Admitting: Podiatry

## 2022-04-01 ENCOUNTER — Ambulatory Visit: Payer: BC Managed Care – PPO | Admitting: Podiatry

## 2022-04-01 ENCOUNTER — Ambulatory Visit (INDEPENDENT_AMBULATORY_CARE_PROVIDER_SITE_OTHER): Payer: BC Managed Care – PPO

## 2022-04-01 DIAGNOSIS — M79671 Pain in right foot: Secondary | ICD-10-CM | POA: Diagnosis not present

## 2022-04-01 DIAGNOSIS — G5791 Unspecified mononeuropathy of right lower limb: Secondary | ICD-10-CM

## 2022-04-01 DIAGNOSIS — M7662 Achilles tendinitis, left leg: Secondary | ICD-10-CM

## 2022-04-01 DIAGNOSIS — M79672 Pain in left foot: Secondary | ICD-10-CM | POA: Diagnosis not present

## 2022-04-01 DIAGNOSIS — S92351A Displaced fracture of fifth metatarsal bone, right foot, initial encounter for closed fracture: Secondary | ICD-10-CM | POA: Diagnosis not present

## 2022-04-01 MED ORDER — METHYLPREDNISOLONE 4 MG PO TBPK: ORAL_TABLET | ORAL | 0 refills | Status: DC

## 2022-04-01 MED ORDER — MELOXICAM 15 MG PO TABS: 15.0000 mg | ORAL_TABLET | Freq: Every day | ORAL | 0 refills | Status: DC

## 2022-04-01 MED ORDER — DEXAMETHASONE SODIUM PHOSPHATE 120 MG/30ML IJ SOLN
4.0000 mg | Freq: Once | INTRAMUSCULAR | Status: AC
  Administered 2022-04-01: 4 mg via INTRA_ARTICULAR

## 2022-04-01 NOTE — Progress Notes (Signed)
  Subjective:  Patient ID: Brian Jones, male    DOB: May 07, 1990,   MRN: [id number]  Chief Complaint  Patient presents with   Foot Pain    Bilateral foot pain patient states right foot is worse than left foot. Right foot is pain is more within his toes and his left foot pain is more within his achillis Tendonitis     32 y.o. male presents for concern of bilateral foot pain that has been going on for several months on the right and the past two weeks on the left. Relates the right foot pain is more in his toes and the left is around his achilles. States with the right he has a history of brostrum repair that left him with numbness and also relates having pain around labor day and had a massage that relieved the pain but now starting to come back. The achilles started two weeks ago. Denies treatments. He is very active.  . Denies any other pedal complaints. Denies n/v/f/c.   Past Medical History:  Diagnosis Date   Chicken pox    Recurrent cold sores     Objective:  Physical Exam: Vascular: DP/PT pulses 2/4 bilateral. CFT <3 seconds. Normal hair growth on digits. No edema.  Skin. No lacerations or abrasions bilateral feet.  Musculoskeletal: MMT 5/5 bilateral lower extremities in DF, PF, Inversion and Eversion. Deceased ROM in DF of ankle joint. Tender to insertion of left achilles tendon wit prominence noted. No pain with DF and PF. No pain proximally along tendon. Mild tenderness around dorsum of first through fifth metatarsals. Most pain seems to be around first and second interspace. Mild positive tinels to the superficial and deep peroneal nerves.  Neurological: Sensation intact to light touch.   Assessment:   1. Neuritis of right foot   2. Achilles tendinitis, left leg   3. Pain in both feet [M79.671, M79.672]      Plan:  Patient was evaluated and treated and all questions answered. -Xrays reviewed. No acute fractures or dislocation noted. Mild spurring noted to posterior left  calcaneus.  -Discussed Achilles insertional tendonitis and treatment options with patient. -Discussed neuritis of the left foot and early degenerative changes in the great toe.   -Offered injection today. Procedure below.  -Discussed stretching exercises. -Rx Meloxicam provided  RX Medrol dose pack  -Powersteps dispensed and discussed possible custom orthotics in the future.  -Heel lifts provided and discussed proper shoewear.  -Discussed if no improvement will consider MRI/PT/EPAT/PRP injections.  -Patient to return to office in 6 weeks to check progress.    Lorenda Peck, DPM

## 2022-04-01 NOTE — Patient Instructions (Signed)

## 2022-04-30 ENCOUNTER — Other Ambulatory Visit: Payer: Self-pay | Admitting: Podiatry

## 2022-05-20 ENCOUNTER — Ambulatory Visit: Payer: BC Managed Care – PPO | Admitting: Podiatry

## 2022-05-20 ENCOUNTER — Encounter: Payer: Self-pay | Admitting: Podiatry

## 2022-05-20 DIAGNOSIS — M7662 Achilles tendinitis, left leg: Secondary | ICD-10-CM | POA: Diagnosis not present

## 2022-05-20 DIAGNOSIS — G5791 Unspecified mononeuropathy of right lower limb: Secondary | ICD-10-CM | POA: Diagnosis not present

## 2022-05-20 NOTE — Progress Notes (Signed)
  Subjective:  Patient ID: Brian Jones, male    DOB: Dec 29, 1990,   MRN: [id number]  Chief Complaint  Patient presents with    neuritis     Patient states right great toe is getting better    Tendonitis    Left foot is still improving     32 y.o. male presents for concern of bilateral foot pain that has been going on for several months on the right and the past two weeks on the left. Relates the right foot pain is more in his toes and the left is around his achilles. States with the right he has a history of brostrum repair that left him with numbness and also relates having pain around labor day and had a massage that relieved the pain but now starting to come back. The achilles started two weeks ago. Denies treatments. He is very active.  . Denies any other pedal complaints. Denies n/v/f/c.   Past Medical History:  Diagnosis Date   Chicken pox    Recurrent cold sores     Objective:  Physical Exam: Vascular: DP/PT pulses 2/4 bilateral. CFT <3 seconds. Normal hair growth on digits. No edema.  Skin. No lacerations or abrasions bilateral feet.  Musculoskeletal: MMT 5/5 bilateral lower extremities in DF, PF, Inversion and Eversion. Deceased ROM in DF of ankle joint. Tender to insertion of left achilles tendon wit prominence noted. No pain with DF and PF. No pain proximally along tendon. Mild tenderness around dorsum of first through fifth metatarsals. Most pain seems to be around first and second interspace. Mild positive tinels to the superficial and deep peroneal nerves.  Neurological: Sensation intact to light touch.   Assessment:   1. Achilles tendinitis, left leg   2. Neuritis of right foot       Plan:  Patient was evaluated and treated and all questions answered. -Xrays reviewed. No acute fractures or dislocation noted. Mild spurring noted to posterior left calcaneus.  -Discussed Achilles insertional tendonitis and treatment options with patient. -Discussed neuritis of the  right foot and early degenerative changes in the great toe.   -Continue stretching exercises -Amb ref PT . -Refill Meloxicam provided  -Continue heel lifts and power steps.  -Discussed if no improvement will consider MRIEPAT/PRP injections.  -Patient to return to office in 9 weeks to check progress.    Louann Sjogren, DPM

## 2022-05-23 ENCOUNTER — Ambulatory Visit: Payer: BC Managed Care – PPO | Admitting: Rehabilitative and Restorative Service Providers"

## 2022-05-26 ENCOUNTER — Ambulatory Visit: Payer: BC Managed Care – PPO | Admitting: Physical Therapy

## 2022-06-01 ENCOUNTER — Ambulatory Visit: Payer: BC Managed Care – PPO | Admitting: Physical Therapy

## 2022-06-10 ENCOUNTER — Ambulatory Visit: Payer: BC Managed Care – PPO | Admitting: Physical Therapy

## 2022-06-12 ENCOUNTER — Other Ambulatory Visit: Payer: Self-pay | Admitting: Podiatry

## 2022-06-17 NOTE — Progress Notes (Unsigned)
[facility] [address] [place], [place] [address] [phone]  Date:  06/20/2022   Name:  Brian Jones   DOB:  [birth year]   MRN:  [id number]  PCP:  Brian Cables, MD    Chief Complaint: No chief complaint on file.   History of Present Illness:  Brian Jones is a 32 y.o. very pleasant male patient who presents with the following:  Generally healthy young man seen today for CPE Last seen by myself 10/22- at that time he was struggling with some chest pain He has consulted with cardiology and had a thorough eval- all seems to be ok, continued exercise recommended   STI screening? Labs can be updated   Patient Active Problem List   Diagnosis Date Noted   Cold sore 09/23/2015    Past Medical History:  Diagnosis Date   Chicken pox    Recurrent cold sores     Past Surgical History:  Procedure Laterality Date   Reconstructive ankle surgery  2016   WISDOM TOOTH EXTRACTION      Social History   Tobacco Use   Smoking status: Never   Smokeless tobacco: Never  Substance Use Topics   Alcohol use: Yes    Alcohol/week: 6.0 standard drinks of alcohol    Types: 6 Cans of beer per week    Family History  Problem Relation Age of Onset   Healthy Mother    Healthy Father    Healthy Sister    Healthy Brother    Alzheimer's disease Maternal Grandmother     Allergies  Allergen Reactions   Amoxicillin Hives    Medication list has been reviewed and updated.  Current Outpatient Medications on File Prior to Visit  Medication Sig Dispense Refill   meloxicam (MOBIC) 15 MG tablet TAKE 1 TABLET(15 MG) BY MOUTH DAILY 30 tablet 1   methylPREDNISolone (MEDROL DOSEPAK) 4 MG TBPK tablet Take as directed 21 tablet 0   valACYclovir (VALTREX) 1000 MG tablet For cold sore outbreak: take 2 pills, then repeat 2 pills 12 hours later (Patient taking differently: as needed. For cold sore outbreak: take 2 pills, then  repeat 2 pills 12 hours later) 20 tablet 0   No current facility-administered medications on file prior to visit.    Review of Systems:  As per HPI- otherwise negative.'   Physical Examination: There were no vitals filed for this visit. There were no vitals filed for this visit. There is no height or weight on file to calculate BMI. Ideal Body Weight:    GEN: no acute distress. HEENT: Atraumatic, Normocephalic.  Ears and Nose: No external deformity. CV: RRR, No M/G/R. No JVD. No thrill. No extra heart sounds. PULM: CTA B, no wheezes, crackles, rhonchi. No retractions. No resp. distress. No accessory muscle use. ABD: S, NT, ND, +BS. No rebound. No HSM. EXTR: No c/c/e PSYCH: Normally interactive. Conversant.    Assessment and Plan: ***  Signed Abbe Amsterdam, MD

## 2022-06-20 ENCOUNTER — Ambulatory Visit (INDEPENDENT_AMBULATORY_CARE_PROVIDER_SITE_OTHER): Payer: BC Managed Care – PPO | Admitting: Family Medicine

## 2022-06-20 VITALS — BP 132/80 | HR 81 | Temp 98.2°F | Resp 18 | Ht 68.0 in | Wt 161.0 lb

## 2022-06-20 DIAGNOSIS — Z131 Encounter for screening for diabetes mellitus: Secondary | ICD-10-CM | POA: Diagnosis not present

## 2022-06-20 DIAGNOSIS — Z13220 Encounter for screening for lipoid disorders: Secondary | ICD-10-CM | POA: Diagnosis not present

## 2022-06-20 DIAGNOSIS — Z0000 Encounter for general adult medical examination without abnormal findings: Secondary | ICD-10-CM

## 2022-06-20 DIAGNOSIS — Z130 Encounter for screening for diseases of the blood and blood-forming organs and certain disorders involving the immune mechanism: Secondary | ICD-10-CM | POA: Diagnosis not present

## 2022-06-20 NOTE — Patient Instructions (Addendum)
It was good to see you again today-  congrats to you and your wife on your exciting news, I hope all goes smoothly for you both  I will be in touch with your labs We will plan to start your colon cancer screening at age 32  Let me know if any concerns

## 2022-06-21 ENCOUNTER — Encounter: Payer: Self-pay | Admitting: Family Medicine

## 2022-06-21 DIAGNOSIS — D1801 Hemangioma of skin and subcutaneous tissue: Secondary | ICD-10-CM | POA: Diagnosis not present

## 2022-06-21 DIAGNOSIS — L814 Other melanin hyperpigmentation: Secondary | ICD-10-CM | POA: Diagnosis not present

## 2022-06-21 DIAGNOSIS — D229 Melanocytic nevi, unspecified: Secondary | ICD-10-CM | POA: Diagnosis not present

## 2022-06-21 DIAGNOSIS — L280 Lichen simplex chronicus: Secondary | ICD-10-CM | POA: Diagnosis not present

## 2022-06-21 DIAGNOSIS — L905 Scar conditions and fibrosis of skin: Secondary | ICD-10-CM | POA: Diagnosis not present

## 2022-06-21 DIAGNOSIS — L578 Other skin changes due to chronic exposure to nonionizing radiation: Secondary | ICD-10-CM | POA: Diagnosis not present

## 2022-06-21 DIAGNOSIS — D485 Neoplasm of uncertain behavior of skin: Secondary | ICD-10-CM | POA: Diagnosis not present

## 2022-06-21 LAB — CBC
HCT: 42.3 % (ref 39.0–52.0)
Hemoglobin: 14.3 g/dL (ref 13.0–17.0)
MCHC: 33.8 g/dL (ref 30.0–36.0)
MCV: 88.9 fl (ref 78.0–100.0)
Platelets: 308 10*3/uL (ref 150.0–400.0)
RBC: 4.75 Mil/uL (ref 4.22–5.81)
RDW: 13.2 % (ref 11.5–15.5)
WBC: 7.7 10*3/uL (ref 4.0–10.5)

## 2022-06-21 LAB — LIPID PANEL
Cholesterol: 225 mg/dL — ABNORMAL HIGH (ref 0–200)
HDL: 67.1 mg/dL (ref >39.00)
LDL Cholesterol: 128 mg/dL — ABNORMAL HIGH (ref 0–99)
NonHDL: 158.28
Total CHOL/HDL Ratio: 3
Triglycerides: 152 mg/dL — ABNORMAL HIGH (ref 0.0–149.0)
VLDL: 30.4 mg/dL (ref 0.0–40.0)

## 2022-06-21 LAB — COMPREHENSIVE METABOLIC PANEL
ALT: 20 U/L (ref 0–53)
AST: 17 U/L (ref 0–37)
Albumin: 4.9 g/dL (ref 3.5–5.2)
Alkaline Phosphatase: 64 U/L (ref 39–117)
BUN: 14 mg/dL (ref 6–23)
CO2: 27 mEq/L (ref 19–32)
Calcium: 10.6 mg/dL — ABNORMAL HIGH (ref 8.4–10.5)
Chloride: 99 mEq/L (ref 96–112)
Creatinine, Ser: 0.73 mg/dL (ref 0.40–1.50)
GFR: 121.56 mL/min (ref >60.00)
Glucose, Bld: 83 mg/dL (ref 70–99)
Potassium: 4.3 mEq/L (ref 3.5–5.1)
Sodium: 137 mEq/L (ref 135–145)
Total Bilirubin: 0.4 mg/dL (ref 0.2–1.2)
Total Protein: 8.3 g/dL (ref 6.0–8.3)

## 2022-06-21 LAB — HEMOGLOBIN A1C: Hgb A1c MFr Bld: 5.5 % (ref 4.6–6.5)

## 2022-07-06 DIAGNOSIS — L439 Lichen planus, unspecified: Secondary | ICD-10-CM | POA: Diagnosis not present

## 2022-07-06 DIAGNOSIS — L905 Scar conditions and fibrosis of skin: Secondary | ICD-10-CM | POA: Diagnosis not present

## 2022-07-15 ENCOUNTER — Ambulatory Visit: Payer: BC Managed Care – PPO | Admitting: Podiatry

## 2022-07-15 ENCOUNTER — Encounter: Payer: Self-pay | Admitting: Podiatry

## 2022-07-15 DIAGNOSIS — G5791 Unspecified mononeuropathy of right lower limb: Secondary | ICD-10-CM | POA: Diagnosis not present

## 2022-07-15 DIAGNOSIS — M7662 Achilles tendinitis, left leg: Secondary | ICD-10-CM | POA: Diagnosis not present

## 2022-07-15 NOTE — Progress Notes (Signed)
  Subjective:  Patient ID: Brian Jones, male    DOB: [birth year],   MRN: [id number]  Chief Complaint  Patient presents with   Foot Pain    Bilateral foot pain     32 y.o. male presents for follow-up of bilateral foot pain. Relates the right foot is doing much better. Relates left foot worse around achilles. States he was unable to do PT as insurance too expensive. He was also traveling and not able to work on the area. He is very active.  . Denies any other pedal complaints. Denies n/v/f/c.   Past Medical History:  Diagnosis Date   Chicken pox    Recurrent cold sores     Objective:  Physical Exam: Vascular: DP/PT pulses 2/4 bilateral. CFT <3 seconds. Normal hair growth on digits. No edema.  Skin. No lacerations or abrasions bilateral feet.  Musculoskeletal: MMT 5/5 bilateral lower extremities in DF, PF, Inversion and Eversion. Deceased ROM in DF of ankle joint. Tender to just proximal to  insertion of left achilles tendon wit prominence noted. No pain with DF and PF. No pain proximally along tendon and no pain around insertion today.  Mild tenderness around dorsum of first through fifth metatarsals. Right foot improved.  Neurological: Sensation intact to light touch.   Assessment:   1. Achilles tendinitis, left leg   2. Neuritis of right foot        Plan:  Patient was evaluated and treated and all questions answered. -Xrays reviewed. No acute fractures or dislocation noted. Mild spurring noted to posterior left calcaneus.  -Discussed Achilles insertional tendonitis and treatment options with patient. -Discussed neuritis of the right foot and early degenerative changes in the great toe.   -Continue stretching exercises -Amb ref PT . External referral to try and see if insurance will cover.  -Continue meloxicam as needed.  -Continue heel lifts and power steps.  -Discussed if no improvement will consider MRIEPAT/PRP injections.  -Patient to return to office in 9 weeks to  check progress.    Louann Sjogren, DPM

## 2022-07-21 ENCOUNTER — Telehealth: Payer: Self-pay | Admitting: Podiatry

## 2022-07-21 DIAGNOSIS — M6281 Muscle weakness (generalized): Secondary | ICD-10-CM | POA: Diagnosis not present

## 2022-07-21 DIAGNOSIS — R262 Difficulty in walking, not elsewhere classified: Secondary | ICD-10-CM | POA: Diagnosis not present

## 2022-07-21 DIAGNOSIS — M7662 Achilles tendinitis, left leg: Secondary | ICD-10-CM | POA: Diagnosis not present

## 2022-07-21 NOTE — Telephone Encounter (Signed)
Email sent for PT referral when unable to get the fax to transmit. Sent to [email or web address] with referral request for PT

## 2022-08-09 ENCOUNTER — Ambulatory Visit: Payer: BC Managed Care – PPO | Admitting: Sports Medicine

## 2022-08-09 VITALS — BP 136/88 | Ht 67.0 in | Wt 156.0 lb

## 2022-08-09 DIAGNOSIS — M7660 Achilles tendinitis, unspecified leg: Secondary | ICD-10-CM | POA: Diagnosis not present

## 2022-08-09 MED ORDER — NITROGLYCERIN 0.2 MG/HR TD PT24: MEDICATED_PATCH | TRANSDERMAL | 1 refills | Status: DC

## 2022-08-09 NOTE — Progress Notes (Signed)
   Subjective:    Patient ID: Brian Jones, male    DOB: January 26, 1991, 32 y.o.   MRN: [id number]  HPI chief complaint: Left Achilles pain  Patient is a very pleasant and active 32 year old male that presents today with approximately 6 months of left Achilles pain that he localizes to the distal Achilles just proximal to the calcaneus.  He has a history of right ankle reconstruction done in 2017.  He did very well postoperatively.  In September 2023 he dislocated his right great toe playing football.  This went undiagnosed for many weeks but was eventually reduced.  He was left with some residual nerve pain for quite some time.  He feels like that led to compensation with putting more pressure on the left leg and there in turn causing the pain in the left Achilles.  He tried self treating with some stretching.  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  He has found relief with a cam walker which he wears primarily at work.  X-rays have been previously done which are unremarkable.  His pain is most noticeable after sitting or first thing in the morning.  He has been able to continue with some forms of exercise but has not been able to resume running.  Past medical history reviewed Medications reviewed Allergies reviewed    Review of Systems As above    Objective:   Physical Exam  Well-developed, fit appearing.  No acute distress  Left Achilles: Slight tenderness to palpation as well as swelling in the distal Achilles tendon proximal to the calcaneus.  Negative calcaneal squeeze.  No palpable defect.  Good pulses.  Limited MSK ultrasound of the left Achilles tendon does show hypoechoic changes as well as neovascularity in the distal Achilles tendon proximal to the calcaneus.  Findings are consistent with Achilles tendinopathy      Assessment & Plan:   Left ankle Achilles tendinopathy  5/16 inch heel lift to be worn in tennis shoes.  He  may continue with his cam walker at work until symptoms improve but he understands that use of this needs to be limited.  He will start a modified Alfredson heel drop program as well as application of a quarter patch nitroglycerin daily.  Follow-up with me again in 4 to 6 weeks for reevaluation.  If symptoms do not initially respond to today's treatment, then I would consider further diagnostic imaging before recommending other forms of treatment such as soundwave or PRP.  This note was dictated using Dragon naturally speaking software and may contain errors in syntax, spelling, or content which have not been identified prior to signing this note.

## 2022-08-09 NOTE — Patient Instructions (Signed)

## 2022-08-18 ENCOUNTER — Other Ambulatory Visit: Payer: Self-pay | Admitting: Podiatry

## 2022-09-13 ENCOUNTER — Ambulatory Visit: Payer: BC Managed Care – PPO | Admitting: Sports Medicine

## 2022-09-13 ENCOUNTER — Encounter: Payer: Self-pay | Admitting: Sports Medicine

## 2022-09-13 VITALS — BP 160/92 | HR 68 | Ht 67.0 in | Wt 156.0 lb

## 2022-09-13 DIAGNOSIS — M7660 Achilles tendinitis, unspecified leg: Secondary | ICD-10-CM

## 2022-09-13 NOTE — Progress Notes (Signed)
   Subjective:    Patient ID: Brian Jones, male    DOB: [birth year], 32 y.o.   MRN: [id number]  HPI  Patient presents today for follow-up on left Achilles tendinopathy.  He is noticing improvement.  His pain is less now.  He has found the heel lifts to be very helpful.  Home exercises are going well.  He no longer needs his cam walker.  He did get some headaches with the nitroglycerin patches so he has weaned to using those every other day (quarter patch).  Review of Systems As above    Objective:   Physical Exam  Well-developed, well-nourished.  No acute distress  Left Achilles: There is still some tenderness to palpation at the distal Achilles tendon and some swelling in this area.  Good strength.      Assessment & Plan:   Improving left Achilles tendinopathy  Patient will continue with current treatment including topical nitroglycerin, home exercises, and heel lifts.  I recommended a total of 3 months of topical nitroglycerin treatment.  He understands that improvement with Achilles tendinopathy is slow but as long as he continues to improve he may follow-up with me as needed.  This note was dictated using Dragon naturally speaking software and may contain errors in syntax, spelling, or content which have not been identified prior to signing this note.

## 2022-09-16 ENCOUNTER — Ambulatory Visit (INDEPENDENT_AMBULATORY_CARE_PROVIDER_SITE_OTHER): Payer: BC Managed Care – PPO | Admitting: Podiatry

## 2022-09-16 DIAGNOSIS — Z91199 Patient's noncompliance with other medical treatment and regimen due to unspecified reason: Secondary | ICD-10-CM

## 2022-09-16 NOTE — Progress Notes (Signed)
No show

## 2022-10-19 ENCOUNTER — Encounter (INDEPENDENT_AMBULATORY_CARE_PROVIDER_SITE_OTHER): Payer: BC Managed Care – PPO | Admitting: Family Medicine

## 2022-10-19 DIAGNOSIS — U071 COVID-19: Secondary | ICD-10-CM | POA: Diagnosis not present

## 2022-10-19 NOTE — Telephone Encounter (Signed)
Please see the MyChart message reply(ies) for my assessment and plan.  The patient gave consent for this Medical Advice Message and is aware that it may result in a bill to their insurance company as well as the possibility that this may result in a co-payment or deductible. They are an established patient, but are not seeking medical advice exclusively about a problem treated during an in person or video visit in the last 7 days. I did not recommend an in person or video visit within 7 days of my reply.  I spent a total of 12 minutes cumulative time within 7 days through [facility] messaging Lamar Blinks, MD

## 2022-11-02 ENCOUNTER — Encounter: Payer: Self-pay | Admitting: Family Medicine

## 2022-11-05 ENCOUNTER — Other Ambulatory Visit: Payer: Self-pay | Admitting: Podiatry

## 2022-11-17 ENCOUNTER — Encounter: Payer: Self-pay | Admitting: Family Medicine

## 2022-11-18 ENCOUNTER — Ambulatory Visit: Payer: BC Managed Care – PPO | Admitting: Family

## 2022-11-18 VITALS — BP 148/86 | HR 77 | Temp 98.0°F | Resp 16 | Ht 67.0 in | Wt 163.2 lb

## 2022-11-18 DIAGNOSIS — R030 Elevated blood-pressure reading, without diagnosis of hypertension: Secondary | ICD-10-CM | POA: Diagnosis not present

## 2022-11-18 MED ORDER — VALSARTAN 80 MG PO TABS: 80.0000 mg | ORAL_TABLET | Freq: Every day | ORAL | 0 refills | Status: DC

## 2022-11-18 NOTE — Progress Notes (Signed)
  Brian Jones is a 32 y.o. male with the following history as recorded in EpicCare:  Patient Active Problem List   Diagnosis Date Noted   Cold sore 09/23/2015    Current Outpatient Medications  Medication Sig Dispense Refill   meloxicam (MOBIC) 15 MG tablet TAKE 1 TABLET(15 MG) BY MOUTH DAILY 30 tablet 1   nitroGLYCERIN (NITRODUR - DOSED IN MG/24 HR) 0.2 mg/hr patch Use 1/4 patch daily to the affected area. 30 patch 1   valACYclovir (VALTREX) 1000 MG tablet For cold sore outbreak: take 2 pills, then repeat 2 pills 12 hours later (Patient taking differently: as needed. For cold sore outbreak: take 2 pills, then repeat 2 pills 12 hours later) 20 tablet 0   valsartan (DIOVAN) 80 MG tablet Take 1 tablet (80 mg total) by mouth daily. 90 tablet 0   No current facility-administered medications for this visit.    Allergies: Amoxicillin  Past Medical History:  Diagnosis Date   Chicken pox    Recurrent cold sores     Past Surgical History:  Procedure Laterality Date   Reconstructive ankle surgery  2016   WISDOM TOOTH EXTRACTION      Family History  Problem Relation Age of Onset   Healthy Mother    Healthy Father    Healthy Sister    Healthy Brother    Alzheimer's disease Maternal Grandmother     Social History   Tobacco Use   Smoking status: Never   Smokeless tobacco: Never  Substance Use Topics   Alcohol use: Yes    Alcohol/week: 6.0 standard drinks of alcohol    Types: 6 Cans of beer per week    Subjective:   Follow up on elevated blood pressure; in reviewing notes, patient has been monitoring for the past 2 years; admits that personal stress level is very high right now- wife is [redacted] weeks pregnant/ will be having planned C-section in addition to work stress;  FH of HTN/ father with borderline blood pressure;   Objective:  Vitals:   11/18/22 1300  BP: (!) 148/86  Pulse: 77  Resp: 16  Temp: 98 F (36.7 C)  TempSrc: Oral  SpO2: 98%  Weight: 163 lb 3.2 oz (74 kg)   Height: 5\' 7"  (1.702 m)    General: Well developed, well nourished, in no acute distress  Skin : Warm and dry.  Head: Normocephalic and atraumatic  Lungs: Respirations unlabored; clear to auscultation bilaterally without wheeze, rales, rhonchi  CVS exam: normal rate and regular rhythm.  Neurologic: Alert and oriented; speech intact; face symmetrical; moves all extremities well; CNII-XII intact without focal deficit    Assessment:  1. Elevated blood pressure reading     Plan:  Previous history of borderline blood pressure readings- now with increased stress and long-term use of Mobic noticing more consistent elevations; udpate EKG- normal sinus rhythm; discussed trying to cut back on daily use of Mobic; trial of Valsartan 80 mg daily- risks/ benefits discussed; he will see his PCP in 2 weeks, sooner prn.   Return in about 2 weeks (around 12/02/2022) for with Dr. Patsy Lager.  Orders Placed This Encounter  Procedures   EKG 12-Lead    Requested Prescriptions   Signed Prescriptions Disp Refills   valsartan (DIOVAN) 80 MG tablet 90 tablet 0    Sig: Take 1 tablet (80 mg total) by mouth daily.

## 2022-11-29 NOTE — Progress Notes (Unsigned)
[facility] [address] [place], [place] [address] [phone]  Date:  12/01/2022   Name:  Brian Jones   DOB:  1990/04/19   MRN:  [id number]  PCP:  Pearline Cables, MD    Chief Complaint: follow up BP (Concerns/ questions:  none)   History of Present Illness:  Brian Jones is a 32 y.o. very pleasant male patient who presents with the following:  Patient seen today for follow-up He was seen by my partner Ria Clock on 10/4 with concern of elevated blood pressure-we have been monitoring this off and on for the last couple of years but did not have to use medication. Vernona Rieger updated his EKG and got him started on valsartan 80 mg daily He also has dealt with CP in the past and has seen cardiology-  most recent eval about a year ago   Pt notes his BP was running up to 150s/80s prior to starting valsartan More recently at home his systolic blood pressure is running 130-140/  He is taking valsartan in the am and tolerating well, he is not aware of any side effects- notes he actually feels better, sx of headache have improved with medication.    He has been under some stress  Baby is scheduled for C/S on 11/19- his wife should not labor due to placenta previa His job is also really stressful right now going through some changes  BP Readings from Last 3 Encounters:  12/01/22 (!) 142/85  11/18/22 (!) 148/86  09/13/22 (!) 160/92     Patient Active Problem List   Diagnosis Date Noted   Essential hypertension 12/01/2022   Cold sore 09/23/2015    Past Medical History:  Diagnosis Date   Chicken pox    Recurrent cold sores     Past Surgical History:  Procedure Laterality Date   Reconstructive ankle surgery  2016   WISDOM TOOTH EXTRACTION      Social History   Tobacco Use   Smoking status: Never   Smokeless tobacco: Never  Substance Use Topics   Alcohol use: Yes    Alcohol/week: 6.0 standard drinks  of alcohol    Types: 6 Cans of beer per week    Family History  Problem Relation Age of Onset   Healthy Mother    Healthy Father    Healthy Sister    Healthy Brother    Alzheimer's disease Maternal Grandmother     Allergies  Allergen Reactions   Amoxicillin Hives    Medication list has been reviewed and updated.  Current Outpatient Medications on File Prior to Visit  Medication Sig Dispense Refill   meloxicam (MOBIC) 15 MG tablet TAKE 1 TABLET(15 MG) BY MOUTH DAILY 30 tablet 1   nitroGLYCERIN (NITRODUR - DOSED IN MG/24 HR) 0.2 mg/hr patch Use 1/4 patch daily to the affected area. 30 patch 1   valACYclovir (VALTREX) 1000 MG tablet For cold sore outbreak: take 2 pills, then repeat 2 pills 12 hours later (Patient taking differently: as needed. For cold sore outbreak: take 2 pills, then repeat 2 pills 12 hours later) 20 tablet 0   valsartan (DIOVAN) 80 MG tablet Take 1 tablet (80 mg total) by mouth daily. 90 tablet 0   No current facility-administered medications on file prior to visit.    Review of Systems:  As per HPI- otherwise negative.   Physical Examination: Vitals:   12/01/22 1321 12/01/22 1336  BP: Marland Kitchen)  152/78 (!) 142/85  Pulse: 92   Resp: 18   Temp: 97.8 F (36.6 C)   SpO2: 99%    Vitals:   12/01/22 1321  Weight: 162 lb 6.4 oz (73.7 kg)   Body mass index is 25.44 kg/m. Ideal Body Weight:    GEN: no acute distress. Normal weight, looks well  HEENT: Atraumatic, Normocephalic.  Ears and Nose: No external deformity. CV: RRR, No M/G/R. No JVD. No thrill. No extra heart sounds. PULM: CTA B, no wheezes, crackles, rhonchi. No retractions. No resp. distress. No accessory muscle use. ABD: S, NT, ND, +BS. No rebound. No HSM. EXTR: No c/c/e PSYCH: Normally interactive. Conversant.    Assessment and Plan: Essential hypertension  Immunization due - Plan: Flu vaccine trivalent PF, 6mos and older(Flulaval,Afluria,Fluarix,Fluzone)  Patient seen today for  follow-up of essential hypertension.  We have been following his blood pressure for a couple of years, he also was seen by cardiology for chest pain and elevated blood pressure in the past-most recent visit was about a year ago, at that time chest pain thought to be noncardiac in origin.  He is feeling good on current dose of valsartan and blood pressure is improved.  He will continue to monitor this at home and if his systolic numbers remain in the 140s he will let me know, I can adjust his dose at that time  Flu vaccine given today  He does note issues with Achilles tendinitis which is limited his running, typically in major stress relief activity for him.  He has been using meloxicam daily which helps, but he wonders if this could be raising his blood pressure.  Will have him try taking half of his meloxicam dose and combine with Tylenol as needed  Signed Abbe Amsterdam, MD

## 2022-12-01 ENCOUNTER — Ambulatory Visit: Payer: BC Managed Care – PPO | Admitting: Family Medicine

## 2022-12-01 ENCOUNTER — Encounter: Payer: Self-pay | Admitting: Family Medicine

## 2022-12-01 VITALS — BP 142/85 | HR 92 | Temp 97.8°F | Resp 18 | Wt 162.4 lb

## 2022-12-01 DIAGNOSIS — I10 Essential (primary) hypertension: Secondary | ICD-10-CM

## 2022-12-01 DIAGNOSIS — Z23 Encounter for immunization: Secondary | ICD-10-CM

## 2022-12-01 NOTE — Patient Instructions (Addendum)
It was good to see you today- I am sorry you are under so much stress!   Congats on your new baby to be!   We can go up a bit on your valsartan if need be- you are not near the max dose   You might try tylenol instead or try combining with your 1/2 meloxicam

## 2023-01-24 ENCOUNTER — Other Ambulatory Visit: Payer: Self-pay | Admitting: Podiatry

## 2023-02-13 ENCOUNTER — Other Ambulatory Visit: Payer: Self-pay | Admitting: Family

## 2023-02-22 ENCOUNTER — Encounter: Payer: Self-pay | Admitting: Family Medicine

## 2023-05-02 ENCOUNTER — Other Ambulatory Visit: Payer: Self-pay | Admitting: *Deleted

## 2023-05-02 MED ORDER — NITROGLYCERIN 0.2 MG/HR TD PT24: MEDICATED_PATCH | TRANSDERMAL | 0 refills | Status: DC

## 2023-05-11 ENCOUNTER — Other Ambulatory Visit: Payer: Self-pay | Admitting: Family Medicine

## 2023-06-16 ENCOUNTER — Other Ambulatory Visit: Payer: Self-pay | Admitting: Family Medicine

## 2023-06-26 ENCOUNTER — Encounter: Payer: Self-pay | Admitting: Family Medicine

## 2023-06-26 ENCOUNTER — Encounter (HOSPITAL_COMMUNITY): Payer: Self-pay

## 2023-06-26 ENCOUNTER — Other Ambulatory Visit: Payer: Self-pay | Admitting: Family Medicine

## 2023-06-26 ENCOUNTER — Other Ambulatory Visit (HOSPITAL_COMMUNITY): Payer: Self-pay

## 2023-06-26 ENCOUNTER — Encounter: Payer: Self-pay | Admitting: Pharmacist

## 2023-06-26 ENCOUNTER — Other Ambulatory Visit: Payer: Self-pay

## 2023-06-26 DIAGNOSIS — B001 Herpesviral vesicular dermatitis: Secondary | ICD-10-CM

## 2023-06-26 MED ORDER — VALACYCLOVIR HCL 1 G PO TABS
ORAL_TABLET | ORAL | 0 refills | Status: DC
  Filled 2023-06-26: qty 20, 5d supply, fill #0

## 2023-06-26 MED ORDER — VALACYCLOVIR HCL 1 G PO TABS: ORAL_TABLET | ORAL | 0 refills | Status: DC

## 2023-07-18 ENCOUNTER — Other Ambulatory Visit: Payer: Self-pay | Admitting: Family Medicine

## 2023-08-27 ENCOUNTER — Other Ambulatory Visit: Payer: Self-pay | Admitting: Family Medicine

## 2023-09-20 ENCOUNTER — Encounter: Payer: Self-pay | Admitting: Family Medicine

## 2023-09-20 MED ORDER — VALSARTAN 80 MG PO TABS: 80.0000 mg | ORAL_TABLET | Freq: Every day | ORAL | 1 refills | Status: DC

## 2024-01-05 ENCOUNTER — Encounter: Payer: Self-pay | Admitting: Family Medicine

## 2024-01-08 NOTE — Progress Notes (Unsigned)
 [facility] [address] [place], [place] [address] [phone]  Date:  01/10/2024   Name:  Brian Jones   DOB:  [birth year]   MRN:  [id number]  PCP:  Watt Harlene BROCKS, MD    Chief Complaint: Testicle Pain (Testicle pain onset shortly after my daughter was born, it lasted about a month and went away. Over the past 3 months if noticed the pain mostly on the L side, its a dull ache. /Urinary frequency )   History of Present Illness:  Brian Jones is a 33 y.o. very pleasant male patient who presents with the following:  Patient seen today for follow-up visit.  I saw him most recently just over a year ago Despite his young age and healthy weight he does have essential hypertension Patient and his wife and infant have rarely relocated to [place]  for his work, they are now back in [place] Patient reached out to me prior to this visit and let me know he has been dealing with some mild left testicular pain and swelling Most recent blood work on chart from May  Flu shot- give today  Taking valsartan  80 mg daily- he takes early am   Discussed the use of AI scribe software for clinical note transcription with the patient, who gave verbal consent to proceed.  History of Present Illness [name] is a 33 year old male who presents with testicular pain.  He has been experiencing testicular pain for approximately a year, which began shortly after the birth of his daughter. Initially, the pain was sudden and resolved quickly, but about three months ago, he noticed a dull, mild pain on the left side around the testicle. This pain has been gradually worsening and sometimes radiates to the groin area. It is described as dull and is not directly on the testicle but around it. The pain is exacerbated by sitting, driving, and mild contact, such as his boxers touching the area. He has not been able to reproduce the pain by touch  and has not felt any masses. No urinary symptoms related to the pain, such as pain during urination or ejaculation. He reports increased frequency of urination but attributes it to high fluid intake, including coffee and water. No fever, chills, or systemic symptoms are present.  He has a history of shingles, with a recent episode occurring a couple of months ago, presenting as a rash above his lip. He took Valtrex  for this episode, which he has used in the past for similar outbreaks. He has a history of shingles on his side at age 32.  He notes that the left testicle sometimes appears enlarged compared to the right and feels like it is positioned higher. He has not been working out as he normally does due to being busy with work and personal stressors.  He is currently taking Valsartan  for hypertension, which he takes first thing in the morning. He recently received a refill of 85 tablets and does not require a new prescription at this time.  He lives in [place]  and has been dealing with significant personal stressors over the past year, including his wife's severe postpartum depression and legal issues related to home repairs.    Patient Active Problem List   Diagnosis Date Noted   Essential hypertension 12/01/2022   Cold sore 09/23/2015    Past Medical History:  Diagnosis Date   Chicken pox    Recurrent cold sores  Past Surgical History:  Procedure Laterality Date   Reconstructive ankle surgery  2016   WISDOM TOOTH EXTRACTION      Social History   Tobacco Use   Smoking status: Never   Smokeless tobacco: Never  Substance Use Topics   Alcohol use: Yes    Alcohol/week: 6.0 standard drinks of alcohol    Types: 6 Cans of beer per week    Family History  Problem Relation Age of Onset   Healthy Mother    Healthy Father    Healthy Sister    Healthy Brother    Alzheimer's disease Maternal Grandmother     Allergies  Allergen Reactions   Amoxicillin Hives     Medication list has been reviewed and updated.  Current Outpatient Medications on File Prior to Visit  Medication Sig Dispense Refill   valsartan  (DIOVAN ) 80 MG tablet Take 1 tablet (80 mg total) by mouth daily. 90 tablet 1   No current facility-administered medications on file prior to visit.    Review of Systems:  As per HPI- otherwise negative.   Physical Examination: Vitals:   01/10/24 1122  BP: 138/84  Pulse: 82  Temp: 98.1 F (36.7 C)  SpO2: 98%   Vitals:   01/10/24 1122  Weight: 156 lb 6.4 oz (70.9 kg)  Height: 5' 7 (1.702 m)   Body mass index is 24.5 kg/m. Ideal Body Weight: Weight in (lb) to have BMI = 25: 159.3  GEN: no acute distress. Normal weight, looks well  HEENT: Atraumatic, Normocephalic.  Bilateral TM wnl, oropharynx normal.  PEERL,EOMI.   Ears and Nose: No external deformity. CV: RRR, No M/G/R. No JVD. No thrill. No extra heart sounds. PULM: CTA B, no wheezes, crackles, rhonchi. No retractions. No resp. distress. No accessory muscle use. ABD: S, NT, ND, +BS. No rebound. No HSM. EXTR: No c/c/e PSYCH: Normally interactive. Conversant.  GU exam is normal No hernia noted No redness, swelling or tenderness, no masses   Assessment and Plan: Physical exam  Screening for diabetes mellitus - Plan: Comprehensive metabolic panel with GFR, Hemoglobin A1c  Screening, lipid - Plan: Lipid panel  Screening for deficiency anemia - Plan: CBC  Urinary frequency - Plan: Urine Culture, POCT urinalysis dipstick  Cold sore - Plan: valACYclovir  (VALTREX ) 1000 MG tablet  Pain in left testicle - Plan: US  SCROTUM W/DOPPLER, CANCELED: US  Scrotum  Need for influenza vaccination - Plan: Flu vaccine trivalent PF, 6mos and older(Flulaval,Afluria,Fluarix,Fluzone)  Assessment & Plan Adult Wellness Visit Routine wellness visit with slightly elevated blood pressure, likely due to time of day. No flu shot received yet. - Ordered routine labs. - Administered flu  shot.  Left-sided testicular and groin pain Chronic dull pain in the left testicular and groin area, worsening over three months. Differential includes musculoskeletal pain, possible hydrocele, or epididymitis. Cranberry juice provided some relief. - Ordered testicular ultrasound  Essential hypertension Blood pressure slightly elevated, possibly due to time of day. Managed with Valsartan . - Refilled Valsartan  prescription.  Herpes simplex labialis Recent shingles-like symptoms above the lip, treated with Valtrex . - Refilled Valtrex  prescription.  Signed Harlene Schroeder, MD  Received labs as below, message to patient Results for orders placed or performed in visit on 01/10/24  CBC   Collection Time: 01/10/24 11:56 AM  Result Value Ref Range   WBC 5.1 4.0 - 10.5 K/uL   RBC 4.68 4.22 - 5.81 Mil/uL   Platelets 301.0 150.0 - 400.0 K/uL   Hemoglobin 14.2 13.0 - 17.0 g/dL  HCT 41.9 39.0 - 52.0 %   MCV 89.5 78.0 - 100.0 fl   MCHC 33.8 30.0 - 36.0 g/dL   RDW 87.2 88.4 - 84.4 %  Comprehensive metabolic panel with GFR   Collection Time: 01/10/24 11:56 AM  Result Value Ref Range   Sodium 138 135 - 145 mEq/L   Potassium 4.3 3.5 - 5.1 mEq/L   Chloride 99 96 - 112 mEq/L   CO2 29 19 - 32 mEq/L   Glucose, Bld 93 70 - 99 mg/dL   BUN 10 6 - 23 mg/dL   Creatinine, Ser 9.24 0.40 - 1.50 mg/dL   Total Bilirubin 0.5 0.2 - 1.2 mg/dL   Alkaline Phosphatase 52 39 - 117 U/L   AST 17 0 - 37 U/L   ALT 20 0 - 53 U/L   Total Protein 8.5 (H) 6.0 - 8.3 g/dL   Albumin 5.4 (H) 3.5 - 5.2 g/dL   GFR 880.73 >39.99 mL/min   Calcium 10.1 8.4 - 10.5 mg/dL  Hemoglobin J8r   Collection Time: 01/10/24 11:56 AM  Result Value Ref Range   Hgb A1c MFr Bld 5.2 4.6 - 6.5 %  Lipid panel   Collection Time: 01/10/24 11:56 AM  Result Value Ref Range   Cholesterol 230 (H) 0 - 200 mg/dL   Triglycerides 798.9 (H) 0.0 - 149.0 mg/dL   HDL 41.89 >60.99 mg/dL   VLDL 59.7 (H) 0.0 - 59.9 mg/dL   LDL Cholesterol 867 (H)  0 - 99 mg/dL   Total CHOL/HDL Ratio 4    NonHDL 172.04   POCT urinalysis dipstick   Collection Time: 01/10/24 11:59 AM  Result Value Ref Range   Color, UA yellow yellow   Clarity, UA clear clear   Glucose, UA negative negative mg/dL   Bilirubin, UA negative negative   Ketones, POC UA negative negative mg/dL   Spec Grav, UA <=8.994 (A) 1.010 - 1.025   Blood, UA negative negative   pH, UA 6.0 5.0 - 8.0   Protein Ur, POC negative negative mg/dL   Urobilinogen, UA 0.2 0.2 or 1.0 E.U./dL   Nitrite, UA Negative Negative   Leukocytes, UA Negative Negative

## 2024-01-10 ENCOUNTER — Ambulatory Visit: Admitting: Family Medicine

## 2024-01-10 ENCOUNTER — Encounter: Payer: Self-pay | Admitting: Family Medicine

## 2024-01-10 ENCOUNTER — Ambulatory Visit

## 2024-01-10 VITALS — BP 138/84 | HR 82 | Temp 98.1°F | Ht 67.0 in | Wt 156.4 lb

## 2024-01-10 DIAGNOSIS — R350 Frequency of micturition: Secondary | ICD-10-CM

## 2024-01-10 DIAGNOSIS — N50812 Left testicular pain: Secondary | ICD-10-CM | POA: Diagnosis not present

## 2024-01-10 DIAGNOSIS — Z23 Encounter for immunization: Secondary | ICD-10-CM | POA: Diagnosis not present

## 2024-01-10 DIAGNOSIS — Z0000 Encounter for general adult medical examination without abnormal findings: Secondary | ICD-10-CM | POA: Diagnosis not present

## 2024-01-10 DIAGNOSIS — B001 Herpesviral vesicular dermatitis: Secondary | ICD-10-CM

## 2024-01-10 DIAGNOSIS — Z130 Encounter for screening for diseases of the blood and blood-forming organs and certain disorders involving the immune mechanism: Secondary | ICD-10-CM

## 2024-01-10 DIAGNOSIS — Z13220 Encounter for screening for lipoid disorders: Secondary | ICD-10-CM

## 2024-01-10 DIAGNOSIS — Z131 Encounter for screening for diabetes mellitus: Secondary | ICD-10-CM

## 2024-01-10 LAB — COMPREHENSIVE METABOLIC PANEL WITH GFR
ALT: 20 U/L (ref 0–53)
AST: 17 U/L (ref 0–37)
Albumin: 5.4 g/dL — ABNORMAL HIGH (ref 3.5–5.2)
Alkaline Phosphatase: 52 U/L (ref 39–117)
BUN: 10 mg/dL (ref 6–23)
CO2: 29 meq/L (ref 19–32)
Calcium: 10.1 mg/dL (ref 8.4–10.5)
Chloride: 99 meq/L (ref 96–112)
Creatinine, Ser: 0.75 mg/dL (ref 0.40–1.50)
GFR: 119.26 mL/min (ref >60.00)
Glucose, Bld: 93 mg/dL (ref 70–99)
Potassium: 4.3 meq/L (ref 3.5–5.1)
Sodium: 138 meq/L (ref 135–145)
Total Bilirubin: 0.5 mg/dL (ref 0.2–1.2)
Total Protein: 8.5 g/dL — ABNORMAL HIGH (ref 6.0–8.3)

## 2024-01-10 LAB — CBC
HCT: 41.9 % (ref 39.0–52.0)
Hemoglobin: 14.2 g/dL (ref 13.0–17.0)
MCHC: 33.8 g/dL (ref 30.0–36.0)
MCV: 89.5 fl (ref 78.0–100.0)
Platelets: 301 K/uL (ref 150.0–400.0)
RBC: 4.68 Mil/uL (ref 4.22–5.81)
RDW: 12.7 % (ref 11.5–15.5)
WBC: 5.1 K/uL (ref 4.0–10.5)

## 2024-01-10 LAB — POCT URINALYSIS DIP (MANUAL ENTRY)
Bilirubin, UA: NEGATIVE
Blood, UA: NEGATIVE
Glucose, UA: NEGATIVE mg/dL
Ketones, POC UA: NEGATIVE mg/dL
Leukocytes, UA: NEGATIVE
Nitrite, UA: NEGATIVE
Protein Ur, POC: NEGATIVE mg/dL
Spec Grav, UA: <=1.005 — AB (ref 1.010–1.025)
Urobilinogen, UA: 0.2 U/dL
pH, UA: 6 (ref 5.0–8.0)

## 2024-01-10 LAB — HEMOGLOBIN A1C: Hgb A1c MFr Bld: 5.2 % (ref 4.6–6.5)

## 2024-01-10 LAB — LIPID PANEL
Cholesterol: 230 mg/dL — ABNORMAL HIGH (ref 0–200)
HDL: 58.1 mg/dL (ref >39.00)
LDL Cholesterol: 132 mg/dL — ABNORMAL HIGH (ref 0–99)
NonHDL: 172.04
Total CHOL/HDL Ratio: 4
Triglycerides: 201 mg/dL — ABNORMAL HIGH (ref 0.0–149.0)
VLDL: 40.2 mg/dL — ABNORMAL HIGH (ref 0.0–40.0)

## 2024-01-10 MED ORDER — VALACYCLOVIR HCL 1 G PO TABS: ORAL_TABLET | ORAL | 0 refills | Status: AC

## 2024-01-10 NOTE — Patient Instructions (Signed)
 Good to see you- I will be in touch with your labs and urine testing We will work on getting you set up for a testicular ultrasound before you head back to [place]  Flu shot today Take care!

## 2024-01-11 LAB — URINE CULTURE
MICRO NUMBER:: 17287010
Result:: NO GROWTH
SPECIMEN QUALITY:: ADEQUATE

## 2024-03-18 ENCOUNTER — Other Ambulatory Visit: Payer: Self-pay | Admitting: Family Medicine
